# Patient Record
Sex: Female | Born: 1970 | Hispanic: Yes | Marital: Married | Smoking: Never smoker
Health system: Southern US, Community
[De-identification: ages and names within clinical notes are randomized; demographics above are authoritative.]

## PROBLEM LIST (undated history)

## (undated) DIAGNOSIS — G709 Myoneural disorder, unspecified: Secondary | ICD-10-CM

## (undated) HISTORY — DX: Myoneural disorder, unspecified: G70.9

---

## 2003-10-19 ENCOUNTER — Emergency Department (HOSPITAL_COMMUNITY): Admission: EM | Admit: 2003-10-19 | Discharge: 2003-10-19 | Payer: Self-pay | Admitting: *Deleted

## 2016-01-07 DIAGNOSIS — Z0000 Encounter for general adult medical examination without abnormal findings: Secondary | ICD-10-CM | POA: Diagnosis not present

## 2016-01-07 DIAGNOSIS — Z131 Encounter for screening for diabetes mellitus: Secondary | ICD-10-CM | POA: Diagnosis not present

## 2016-01-07 DIAGNOSIS — D649 Anemia, unspecified: Secondary | ICD-10-CM | POA: Diagnosis not present

## 2016-01-07 DIAGNOSIS — Z130 Encounter for screening for diseases of the blood and blood-forming organs and certain disorders involving the immune mechanism: Secondary | ICD-10-CM | POA: Diagnosis not present

## 2016-01-14 DIAGNOSIS — R4189 Other symptoms and signs involving cognitive functions and awareness: Secondary | ICD-10-CM | POA: Diagnosis not present

## 2016-01-28 DIAGNOSIS — R4189 Other symptoms and signs involving cognitive functions and awareness: Secondary | ICD-10-CM | POA: Diagnosis not present

## 2016-01-28 DIAGNOSIS — Z1329 Encounter for screening for other suspected endocrine disorder: Secondary | ICD-10-CM | POA: Diagnosis not present

## 2016-02-13 DIAGNOSIS — Z79899 Other long term (current) drug therapy: Secondary | ICD-10-CM | POA: Diagnosis not present

## 2016-02-13 DIAGNOSIS — F0390 Unspecified dementia without behavioral disturbance: Secondary | ICD-10-CM | POA: Diagnosis not present

## 2016-02-13 DIAGNOSIS — R197 Diarrhea, unspecified: Secondary | ICD-10-CM | POA: Diagnosis not present

## 2016-02-13 DIAGNOSIS — R262 Difficulty in walking, not elsewhere classified: Secondary | ICD-10-CM | POA: Diagnosis not present

## 2016-02-13 DIAGNOSIS — N390 Urinary tract infection, site not specified: Secondary | ICD-10-CM | POA: Diagnosis not present

## 2016-02-13 DIAGNOSIS — R112 Nausea with vomiting, unspecified: Secondary | ICD-10-CM | POA: Diagnosis not present

## 2016-02-13 DIAGNOSIS — R531 Weakness: Secondary | ICD-10-CM | POA: Diagnosis not present

## 2016-03-18 DIAGNOSIS — R413 Other amnesia: Secondary | ICD-10-CM | POA: Diagnosis not present

## 2016-03-18 DIAGNOSIS — Z789 Other specified health status: Secondary | ICD-10-CM | POA: Diagnosis not present

## 2016-03-18 DIAGNOSIS — Z719 Counseling, unspecified: Secondary | ICD-10-CM | POA: Diagnosis not present

## 2016-03-20 DIAGNOSIS — R413 Other amnesia: Secondary | ICD-10-CM | POA: Diagnosis not present

## 2016-04-09 DIAGNOSIS — R413 Other amnesia: Secondary | ICD-10-CM | POA: Diagnosis not present

## 2016-04-19 DIAGNOSIS — R262 Difficulty in walking, not elsewhere classified: Secondary | ICD-10-CM | POA: Diagnosis not present

## 2016-04-19 DIAGNOSIS — R413 Other amnesia: Secondary | ICD-10-CM | POA: Diagnosis not present

## 2016-04-19 DIAGNOSIS — M50222 Other cervical disc displacement at C5-C6 level: Secondary | ICD-10-CM | POA: Diagnosis not present

## 2016-04-19 DIAGNOSIS — R443 Hallucinations, unspecified: Secondary | ICD-10-CM | POA: Diagnosis not present

## 2016-04-28 DIAGNOSIS — R413 Other amnesia: Secondary | ICD-10-CM | POA: Diagnosis not present

## 2016-04-28 DIAGNOSIS — Z719 Counseling, unspecified: Secondary | ICD-10-CM | POA: Diagnosis not present

## 2016-04-28 DIAGNOSIS — Z789 Other specified health status: Secondary | ICD-10-CM | POA: Diagnosis not present

## 2016-04-29 DIAGNOSIS — Z993 Dependence on wheelchair: Secondary | ICD-10-CM | POA: Diagnosis not present

## 2016-04-29 DIAGNOSIS — G3289 Other specified degenerative disorders of nervous system in diseases classified elsewhere: Secondary | ICD-10-CM | POA: Diagnosis not present

## 2016-04-29 DIAGNOSIS — R32 Unspecified urinary incontinence: Secondary | ICD-10-CM | POA: Diagnosis not present

## 2016-04-29 DIAGNOSIS — E039 Hypothyroidism, unspecified: Secondary | ICD-10-CM | POA: Diagnosis not present

## 2016-04-29 DIAGNOSIS — R413 Other amnesia: Secondary | ICD-10-CM | POA: Diagnosis not present

## 2016-04-29 DIAGNOSIS — M6281 Muscle weakness (generalized): Secondary | ICD-10-CM | POA: Diagnosis not present

## 2016-05-03 DIAGNOSIS — Z993 Dependence on wheelchair: Secondary | ICD-10-CM | POA: Diagnosis not present

## 2016-05-03 DIAGNOSIS — R32 Unspecified urinary incontinence: Secondary | ICD-10-CM | POA: Diagnosis not present

## 2016-05-03 DIAGNOSIS — E039 Hypothyroidism, unspecified: Secondary | ICD-10-CM | POA: Diagnosis not present

## 2016-05-03 DIAGNOSIS — M6281 Muscle weakness (generalized): Secondary | ICD-10-CM | POA: Diagnosis not present

## 2016-05-03 DIAGNOSIS — G3289 Other specified degenerative disorders of nervous system in diseases classified elsewhere: Secondary | ICD-10-CM | POA: Diagnosis not present

## 2016-05-03 DIAGNOSIS — R413 Other amnesia: Secondary | ICD-10-CM | POA: Diagnosis not present

## 2016-05-05 DIAGNOSIS — R413 Other amnesia: Secondary | ICD-10-CM | POA: Diagnosis not present

## 2016-05-05 DIAGNOSIS — G3289 Other specified degenerative disorders of nervous system in diseases classified elsewhere: Secondary | ICD-10-CM | POA: Diagnosis not present

## 2016-05-05 DIAGNOSIS — R32 Unspecified urinary incontinence: Secondary | ICD-10-CM | POA: Diagnosis not present

## 2016-05-05 DIAGNOSIS — Z993 Dependence on wheelchair: Secondary | ICD-10-CM | POA: Diagnosis not present

## 2016-05-05 DIAGNOSIS — E039 Hypothyroidism, unspecified: Secondary | ICD-10-CM | POA: Diagnosis not present

## 2016-05-05 DIAGNOSIS — M6281 Muscle weakness (generalized): Secondary | ICD-10-CM | POA: Diagnosis not present

## 2016-05-07 DIAGNOSIS — Z993 Dependence on wheelchair: Secondary | ICD-10-CM | POA: Diagnosis not present

## 2016-05-07 DIAGNOSIS — R32 Unspecified urinary incontinence: Secondary | ICD-10-CM | POA: Diagnosis not present

## 2016-05-07 DIAGNOSIS — R413 Other amnesia: Secondary | ICD-10-CM | POA: Diagnosis not present

## 2016-05-07 DIAGNOSIS — G3289 Other specified degenerative disorders of nervous system in diseases classified elsewhere: Secondary | ICD-10-CM | POA: Diagnosis not present

## 2016-05-07 DIAGNOSIS — M6281 Muscle weakness (generalized): Secondary | ICD-10-CM | POA: Diagnosis not present

## 2016-05-07 DIAGNOSIS — E039 Hypothyroidism, unspecified: Secondary | ICD-10-CM | POA: Diagnosis not present

## 2016-05-10 DIAGNOSIS — R32 Unspecified urinary incontinence: Secondary | ICD-10-CM | POA: Diagnosis not present

## 2016-05-10 DIAGNOSIS — G3289 Other specified degenerative disorders of nervous system in diseases classified elsewhere: Secondary | ICD-10-CM | POA: Diagnosis not present

## 2016-05-10 DIAGNOSIS — E039 Hypothyroidism, unspecified: Secondary | ICD-10-CM | POA: Diagnosis not present

## 2016-05-10 DIAGNOSIS — Z993 Dependence on wheelchair: Secondary | ICD-10-CM | POA: Diagnosis not present

## 2016-05-10 DIAGNOSIS — R413 Other amnesia: Secondary | ICD-10-CM | POA: Diagnosis not present

## 2016-05-10 DIAGNOSIS — M6281 Muscle weakness (generalized): Secondary | ICD-10-CM | POA: Diagnosis not present

## 2016-05-12 DIAGNOSIS — G3289 Other specified degenerative disorders of nervous system in diseases classified elsewhere: Secondary | ICD-10-CM | POA: Diagnosis not present

## 2016-05-12 DIAGNOSIS — R413 Other amnesia: Secondary | ICD-10-CM | POA: Diagnosis not present

## 2016-05-12 DIAGNOSIS — R32 Unspecified urinary incontinence: Secondary | ICD-10-CM | POA: Diagnosis not present

## 2016-05-12 DIAGNOSIS — M6281 Muscle weakness (generalized): Secondary | ICD-10-CM | POA: Diagnosis not present

## 2016-05-12 DIAGNOSIS — E039 Hypothyroidism, unspecified: Secondary | ICD-10-CM | POA: Diagnosis not present

## 2016-05-12 DIAGNOSIS — Z993 Dependence on wheelchair: Secondary | ICD-10-CM | POA: Diagnosis not present

## 2016-05-14 DIAGNOSIS — R32 Unspecified urinary incontinence: Secondary | ICD-10-CM | POA: Diagnosis not present

## 2016-05-14 DIAGNOSIS — G3289 Other specified degenerative disorders of nervous system in diseases classified elsewhere: Secondary | ICD-10-CM | POA: Diagnosis not present

## 2016-05-14 DIAGNOSIS — Z993 Dependence on wheelchair: Secondary | ICD-10-CM | POA: Diagnosis not present

## 2016-05-14 DIAGNOSIS — M6281 Muscle weakness (generalized): Secondary | ICD-10-CM | POA: Diagnosis not present

## 2016-05-14 DIAGNOSIS — E039 Hypothyroidism, unspecified: Secondary | ICD-10-CM | POA: Diagnosis not present

## 2016-05-14 DIAGNOSIS — R413 Other amnesia: Secondary | ICD-10-CM | POA: Diagnosis not present

## 2016-05-15 ENCOUNTER — Ambulatory Visit (INDEPENDENT_AMBULATORY_CARE_PROVIDER_SITE_OTHER): Payer: Medicare Other

## 2016-05-15 ENCOUNTER — Ambulatory Visit (INDEPENDENT_AMBULATORY_CARE_PROVIDER_SITE_OTHER): Payer: Medicare Other | Admitting: Family Medicine

## 2016-05-15 VITALS — BP 126/76 | HR 99 | Temp 98.0°F | Resp 17

## 2016-05-15 DIAGNOSIS — R600 Localized edema: Secondary | ICD-10-CM

## 2016-05-15 DIAGNOSIS — M25512 Pain in left shoulder: Secondary | ICD-10-CM

## 2016-05-15 DIAGNOSIS — G8929 Other chronic pain: Secondary | ICD-10-CM | POA: Diagnosis not present

## 2016-05-15 MED ORDER — FUROSEMIDE 20 MG PO TABS: 20.0000 mg | ORAL_TABLET | Freq: Every day | ORAL | 0 refills | Status: DC

## 2016-05-15 MED ORDER — DICLOFENAC SODIUM 3 % EX CREA: 1.0000 "application " | TOPICAL_CREAM | Freq: Three times a day (TID) | CUTANEOUS | 0 refills | Status: AC

## 2016-05-15 NOTE — Patient Instructions (Addendum)
Try a medium grade of compression socks, around 15 mmHg, size small to medium. You can find an excellent repair of these at [employer], [employer] sporting goods, [employer], or [employer]. I have tried many different type on [place] with WONDERFUL success such as [employer] and [employer].  Try to have her drink more water. Make sure she continues to get plenty of protein in the diet. Try to elevate her legs whenever possible. When she is sitting during the day if she could put them on a cushion or autoimmune to elevate that would be great. We may need to try to get her a wheelchair that has the ability to elevate feet. At night putting a small wedge pillow or even some towels underneath her feet can help substantially.  If none of this works it may be worth trying one week off of the Seroquel to see if there are indeed any changes in sleep or mood and the swelling. If it is not helping sleep or mood and contributing to the swelling then perhaps it is not the medication for her.  Start the Lasix once a day in the morning. This will help remove any extra fluid from the body. It can lower her potassium so make sure Melissa Stewart continues to drink a lot of water and gets 1 or 2 extra sources of potassium in her diet that day. See list attached  IF you received an x-ray today, you will receive an invoice from [facility]. Please contact [facility] at [phone] with questions or concerns regarding your invoice.   IF you received labwork today, you will receive an invoice from [facility]. Please contact [facility] at [phone] with questions or concerns regarding your invoice.   Our billing staff will not be able to assist you with questions regarding bills from these companies.  You will be contacted with the lab results as soon as they are available. The fastest way to get your results is to activate your My Chart account. Instructions are located on the last page of this paperwork. If you  have not heard from [place] regarding the results in 2 weeks, please contact this office.     Peripheral Edema Peripheral edema is swelling that is caused by a buildup of fluid. Peripheral edema most often affects the lower legs, ankles, and feet. It can also develop in the arms, hands, and face. The area of the body that has peripheral edema will look swollen. It may also feel heavy or warm. Your clothes may start to feel tight. Pressing on the area may make a temporary dent in your skin. You may not be able to move your arm or leg as much as usual. There are many causes of peripheral edema. It can be a complication of other diseases, such as congestive heart failure, kidney disease, or a problem with your blood circulation. It also can be a side effect of certain medicines. It often happens to women during pregnancy. Sometimes, the cause is not known. Treating the underlying condition is often the only treatment for peripheral edema. Follow these instructions at home: Pay attention to any changes in your symptoms. Take these actions to help with your discomfort:  Raise (elevate) your legs while you are sitting or lying down.  Move around often to prevent stiffness and to lessen swelling. Do not sit or stand for long periods of time.  Wear support stockings as told by your health care provider.  Follow instructions from your health care provider about limiting  salt (sodium) in your diet. Sometimes eating less salt can reduce swelling.  Take over-the-counter and prescription medicines only as told by your health care provider. Your health care provider may prescribe medicine to help your body get rid of excess water (diuretic).  Keep all follow-up visits as told by your health care provider. This is important. Contact a health care provider if:  You have a fever.  Your edema starts suddenly or is getting worse, especially if you are pregnant or have a medical condition.  You have swelling in  only one leg.  You have increased swelling and pain in your legs. Get help right away if:  You develop shortness of breath, especially when you are lying down.  You have pain in your chest or abdomen.  You feel weak.  You faint. This information is not intended to replace advice given to you by your health care provider. Make sure you discuss any questions you have with your health care provider. Document Released: 04/29/2004 Document Revised: 08/25/2015 Document Reviewed: 10/02/2014 Elsevier Interactive Patient Education  2017 Elsevier Inc.  Potassium Content of Foods Potassium is a mineral found in many foods and drinks. It helps keep fluids and minerals balanced in your body and affects how steadily your heart beats. Potassium also helps control your blood pressure and keep your muscles and nervous system healthy. Certain health conditions and medicines may change the balance of potassium in your body. When this happens, you can help balance your level of potassium through the foods that you do or do not eat. Your health care provider or dietitian may recommend an amount of potassium that you should have each day. The following lists of foods provide the amount of potassium (in parentheses) per serving in each item. High in potassium The following foods and beverages have 200 mg or more of potassium per serving:  Apricots, 2 raw or 5 dry (200 mg).  Artichoke, 1 medium (345 mg).  Avocado, raw,  each (245 mg).  Banana, 1 medium (425 mg).  Beans, lima, or baked beans, canned,  cup (280 mg).  Beans, white, canned,  cup (595 mg).  Beef roast, 3 oz (320 mg).  Beef, ground, 3 oz (270 mg).  Beets, raw or cooked,  cup (260 mg).  Bran muffin, 2 oz (300 mg).  Broccoli,  cup (230 mg).  Brussels sprouts,  cup (250 mg).  Cantaloupe,  cup (215 mg).  Cereal, 100% bran,  cup (200-400 mg).  Cheeseburger, single, fast food, 1 each (225-400 mg).  Chicken, 3 oz (220  mg).  Clams, canned, 3 oz (535 mg).  Crab, 3 oz (225 mg).  Dates, 5 each (270 mg).  Dried beans and peas,  cup (300-475 mg).  Figs, dried, 2 each (260 mg).  Fish: halibut, tuna, cod, snapper, 3 oz (480 mg).  Fish: salmon, haddock, swordfish, perch, 3 oz (300 mg).  Fish, tuna, canned 3 oz (200 mg).  [place] fries, fast food, 3 oz (470 mg).  Granola with fruit and nuts,  cup (200 mg).  Grapefruit juice,  cup (200 mg).  Greens, beet,  cup (655 mg).  Honeydew melon,  cup (200 mg).  Kale, raw, 1 cup (300 mg).  Kiwi, 1 medium (240 mg).  Kohlrabi, rutabaga, parsnips,  cup (280 mg).  Lentils,  cup (365 mg).  Mango, 1 each (325 mg).  Milk, chocolate, 1 cup (420 mg).  Milk: nonfat, low-fat, whole, buttermilk, 1 cup (350-380 mg).  Molasses, 1 Tbsp (295 mg).  Mushrooms,  cup (280) mg.  Nectarine, 1 each (275 mg).  Nuts: almonds, peanuts, hazelnuts, [place], cashew, mixed, 1 oz (200 mg).  Nuts, pistachios, 1 oz (295 mg).  Orange, 1 each (240 mg).  Orange juice,  cup (235 mg).  Papaya, medium,  fruit (390 mg).  Peanut butter, chunky, 2 Tbsp (240 mg).  Peanut butter, smooth, 2 Tbsp (210 mg).  Pear, 1 medium (200 mg).  Pomegranate, 1 whole (400 mg).  Pomegranate juice,  cup (215 mg).  Pork, 3 oz (350 mg).  Potato chips, salted, 1 oz (465 mg).  Potato, baked with skin, 1 medium (925 mg).  Potatoes, boiled,  cup (255 mg).  Potatoes, mashed,  cup (330 mg).  Prune juice,  cup (370 mg).  Prunes, 5 each (305 mg).  Pudding, chocolate,  cup (230 mg).  Pumpkin, canned,  cup (250 mg).  Raisins, seedless,  cup (270 mg).  Seeds, sunflower or pumpkin, 1 oz (240 mg).  Soy milk, 1 cup (300 mg).  Spinach,  cup (420 mg).  Spinach, canned,  cup (370 mg).  Sweet potato, baked with skin, 1 medium (450 mg).  Swiss chard,  cup (480 mg).  Tomato or vegetable juice,  cup (275 mg).  Tomato sauce or puree,  cup (400-550 mg).  Tomato,  raw, 1 medium (290 mg).  Tomatoes, canned,  cup (200-300 mg).  [place], 3 oz (250 mg).  Wheat germ, 1 oz (250 mg).  Winter squash,  cup (250 mg).  Yogurt, plain or fruited, 6 oz (260-435 mg).  Zucchini,  cup (220 mg). Moderate in potassium The following foods and beverages have 50-200 mg of potassium per serving:  Apple, 1 each (150 mg).  Apple juice,  cup (150 mg).  Applesauce,  cup (90 mg).  Apricot nectar,  cup (140 mg).  Asparagus, small spears,  cup or 6 spears (155 mg).  Bagel, cinnamon raisin, 1 each (130 mg).  Bagel, egg or plain, 4 in., 1 each (70 mg).  Beans, green,  cup (90 mg).  Beans, yellow,  cup (190 mg).  Beer, regular, 12 oz (100 mg).  Beets, canned,  cup (125 mg).  Blackberries,  cup (115 mg).  Blueberries,  cup (60 mg).  Bread, whole wheat, 1 slice (70 mg).  Broccoli, raw,  cup (145 mg).  Cabbage,  cup (150 mg).  Carrots, cooked or raw,  cup (180 mg).  Cauliflower, raw,  cup (150 mg).  Celery, raw,  cup (155 mg).  Cereal, bran flakes, cup (120-150 mg).  Cheese, cottage,  cup (110 mg).  Cherries, 10 each (150 mg).  Chocolate, 1 oz bar (165 mg).  Coffee, brewed 6 oz (90 mg).  Corn,  cup or 1 ear (195 mg).  Cucumbers,  cup (80 mg).  Egg, large, 1 each (60 mg).  Eggplant,  cup (60 mg).  Endive, raw, cup (80 mg).  English muffin, 1 each (65 mg).  Fish, orange roughy, 3 oz (150 mg).  Frankfurter, beef or pork, 1 each (75 mg).  Fruit cocktail,  cup (115 mg).  Grape juice,  cup (170 mg).  Grapefruit,  fruit (175 mg).  Grapes,  cup (155 mg).  Greens: kale, turnip, collard,  cup (110-150 mg).  Ice cream or frozen yogurt, chocolate,  cup (175 mg).  Ice cream or frozen yogurt, vanilla,  cup (120-150 mg).  Lemons, limes, 1 each (80 mg).  Lettuce, all types, 1 cup (100 mg).  Mixed vegetables,  cup (150 mg).  Mushrooms, raw,  cup (110 mg).  Nuts: walnuts, pecans, or macadamia, 1  oz (125 mg).  Oatmeal,  cup (80 mg).  Okra,  cup (110 mg).  Onions, raw,  cup (120 mg).  Peach, 1 each (185 mg).  Peaches, canned,  cup (120 mg).  Pears, canned,  cup (120 mg).  Peas, green, frozen,  cup (90 mg).  Peppers, green,  cup (130 mg).  Peppers, red,  cup (160 mg).  Pineapple juice,  cup (165 mg).  Pineapple, fresh or canned,  cup (100 mg).  Plums, 1 each (105 mg).  Pudding, vanilla,  cup (150 mg).  Raspberries,  cup (90 mg).  Rhubarb,  cup (115 mg).  Rice, wild,  cup (80 mg).  Shrimp, 3 oz (155 mg).  Spinach, raw, 1 cup (170 mg).  Strawberries,  cup (125 mg).  Summer squash  cup (175-200 mg).  Swiss chard, raw, 1 cup (135 mg).  Tangerines, 1 each (140 mg).  Tea, brewed, 6 oz (65 mg).  Turnips,  cup (140 mg).  Watermelon,  cup (85 mg).  Wine, red, table, 5 oz (180 mg).  Wine, white, table, 5 oz (100 mg). Low in potassium The following foods and beverages have less than 50 mg of potassium per serving.  Bread, white, 1 slice (30 mg).  Carbonated beverages, 12 oz (less than 5 mg).  Cheese, 1 oz (20-30 mg).  Cranberries,  cup (45 mg).  Cranberry juice cocktail,  cup (20 mg).  Fats and oils, 1 Tbsp (less than 5 mg).  Hummus, 1 Tbsp (32 mg).  Nectar: papaya, mango, or pear,  cup (35 mg).  Rice, white or brown,  cup (50 mg).  Spaghetti or macaroni,  cup cooked (30 mg).  Tortilla, flour or corn, 1 each (50 mg).  Waffle, 4 in., 1 each (50 mg).  Water chestnuts,  cup (40 mg). This information is not intended to replace advice given to you by your health care provider. Make sure you discuss any questions you have with your health care provider. Document Released: 11/03/2004 Document Revised: 08/28/2015 Document Reviewed: 02/16/2013 Elsevier Interactive Patient Education  2017 [employer].

## 2016-05-15 NOTE — Progress Notes (Signed)
Subjective:  By signing my name below, I, Essence Howell, attest that this documentation has been prepared under the direction and in the presence of Delman Cheadle, MD Electronically Signed: Ladene Artist, ED Scribe 05/15/2016 at 11:35 AM.   Patient ID: Melissa Stewart, female    DOB: Aug 09, 1970, 46 y.o.   MRN: [id number]  Chief Complaint  Patient presents with  . Leg Pain    swelling in legs    HPI  Melissa Stewart is a 46 y.o. female who presents to Primary Care at [facility] complaining of gradual onset of swelling to the legs, knees, feet and left shoulder first noticed last week. Pt recently started physical therapy around January 26 for improved ambulation for a neuromuscular disorder which is followed by Dr. Elisabeth Cara with [facility]. Pt last had blood work obtained in January which was normal. She spends most of her time in a chair but has been trying to use a walker. She also started both Seroquel and Aricept around January 25 as well. However, pt's sister states that Seroquel has not provided any relief of chronic sleep difficulty. No OTC medications tried PTC for relief of gradually worsening left shoulder pain. Pt is right hand dominant. She denies chest pain, chest tightness, sob, changes in sleep, loss of appetite, changes in urine or bowels. Her  sister recently became her legal guardian in November 2017 and states that pt has a h/o leg swelling that was more severe prior to when she obtained guardianship and pt beginning Seroquel. She attributes this to pt sitting in a chair for 8 hours/day. No h/o heart complications.   Past Medical History:  Diagnosis Date  . Neuromuscular disorder ([place])    No current outpatient prescriptions on file prior to visit.   No current facility-administered medications on file prior to visit.    Not on File  Review of Systems  Constitutional: Negative for appetite change.  Respiratory: Negative for chest tightness and shortness of breath.     Cardiovascular: Positive for leg swelling. Negative for chest pain.  Gastrointestinal: Negative for constipation and diarrhea.  Genitourinary: Negative for difficulty urinating and hematuria.  Psychiatric/Behavioral: Positive for sleep disturbance (chronic; unchanged).      Objective:   Physical Exam  Constitutional: She is oriented to person, place, and time. She appears well-developed and well-nourished. No distress.  HENT:  Head: Normocephalic and atraumatic.  Eyes: Conjunctivae and EOM are normal.  Neck: Neck supple. No tracheal deviation present.  Cardiovascular: Normal rate, regular rhythm, S1 normal, S2 normal and normal heart sounds.   Pulmonary/Chest: Effort normal and breath sounds normal. No respiratory distress.  Good air movement. Lungs are clear to auscultation.  Musculoskeletal: Normal range of motion.  1+ pitting edema to knees. Large palpable trapezius muscle spasms at the top of the thoracic approximately the size of a palm. No erythema or warmth. No point bony tenderness over the shoulders. Appears symmetrical. No pitting edema along sacrum. R shoulder abduction to 75 degrees, L shoulder abduction to 80 degrees. Unable to raise or lower shoulders.   Neurological: She is alert and oriented to person, place, and time.  Skin: Skin is warm and dry.  Psychiatric: She has a normal mood and affect. Her behavior is normal.  Nursing note and vitals reviewed.  BP 126/76 (BP Location: Left Arm, Patient Position: Sitting, Cuff Size: Large)   Pulse 99   Temp 98 F (36.7 C) (Oral)   Resp 17   SpO2 94%  Dg Shoulder Left  Result Date: 05/15/2016 CLINICAL DATA:  Lt shoulder pain no known injury Sister felt knot posterior shoulder NCP shielded EXAM: LEFT SHOULDER - 2+ VIEW COMPARISON:  None. FINDINGS: Glenohumeral joint is intact. No evidence of scapular fracture or humeral fracture. The acromioclavicular joint is intact. IMPRESSION: 1. No fracture or dislocation. 2. 3. No  arthropathy identified. Electronically Signed   By: Suzy Bouchard M.D.   On: 05/15/2016 12:42   Assessment & Plan:   1. Chronic left shoulder pain   2. Pedal edema    Ok to make appt at 104 to establish care if her family would like. She has some congenital cognitive and neuromuscular disorder.  Her sisters just recently obtained care of her so much is unknown.  Needs to elevate legs, try compression socks.  Orders Placed This Encounter  Procedures  . DG Shoulder Left    Standing Status:   Future    Number of Occurrences:   1    Standing Expiration Date:   05/15/2017    Order Specific Question:   Reason for Exam (SYMPTOM  OR DIAGNOSIS REQUIRED)    Answer:   PAIN AND SWELLING    Order Specific Question:   Is the patient pregnant?    Answer:   No    Order Specific Question:   Preferred imaging location?    Answer:   External  . Comprehensive metabolic panel  . CBC with Differential/Platelet  . CK    Meds ordered this encounter  Medications  . donepezil (ARICEPT) 10 MG tablet    Sig: Take 10 mg by mouth at bedtime.  Marland Kitchen QUEtiapine (SEROQUEL) 100 MG tablet    Sig: Take 100 mg by mouth at bedtime.  . furosemide (LASIX) 20 MG tablet    Sig: Take 1 tablet (20 mg total) by mouth daily.    Dispense:  30 tablet    Refill:  0  . Diclofenac Sodium 3 % CREA    Sig: Apply 1 application topically 3 (three) times daily.    Dispense:  2500 g    Refill:  0    I personally performed the services described in this documentation, which was scribed in my presence. The recorded information has been reviewed and considered, and addended by me as needed.   Delman Cheadle, M.D.  Primary Care at [facility] [address] [place], [place] [address] [phone] phone [phone] fax  05/30/16 12:57 AM

## 2016-05-17 DIAGNOSIS — E039 Hypothyroidism, unspecified: Secondary | ICD-10-CM | POA: Diagnosis not present

## 2016-05-17 DIAGNOSIS — R413 Other amnesia: Secondary | ICD-10-CM | POA: Diagnosis not present

## 2016-05-17 DIAGNOSIS — G3289 Other specified degenerative disorders of nervous system in diseases classified elsewhere: Secondary | ICD-10-CM | POA: Diagnosis not present

## 2016-05-17 DIAGNOSIS — R32 Unspecified urinary incontinence: Secondary | ICD-10-CM | POA: Diagnosis not present

## 2016-05-17 DIAGNOSIS — M6281 Muscle weakness (generalized): Secondary | ICD-10-CM | POA: Diagnosis not present

## 2016-05-17 DIAGNOSIS — Z993 Dependence on wheelchair: Secondary | ICD-10-CM | POA: Diagnosis not present

## 2016-05-17 LAB — COMPREHENSIVE METABOLIC PANEL
ALK PHOS: 114 IU/L (ref 39–117)
ALT: 44 IU/L — ABNORMAL HIGH (ref 0–32)
AST: 31 IU/L (ref 0–40)
Albumin/Globulin Ratio: 1.3 (ref 1.2–2.2)
Albumin: 4.1 g/dL (ref 3.5–5.5)
BUN / CREAT RATIO: 14 (ref 9–23)
BUN: 12 mg/dL (ref 6–24)
CHLORIDE: 102 mmol/L (ref 96–106)
CO2: 24 mmol/L (ref 18–29)
CREATININE: 0.86 mg/dL (ref 0.57–1.00)
Calcium: 9.2 mg/dL (ref 8.7–10.2)
GFR calc Af Amer: 94 mL/min/{1.73_m2} (ref >59)
GFR calc non Af Amer: 82 mL/min/{1.73_m2} (ref >59)
GLOBULIN, TOTAL: 3.1 g/dL (ref 1.5–4.5)
GLUCOSE: 86 mg/dL (ref 65–99)
Potassium: 3.7 mmol/L (ref 3.5–5.2)
SODIUM: 143 mmol/L (ref 134–144)
Total Protein: 7.2 g/dL (ref 6.0–8.5)

## 2016-05-17 LAB — CBC WITH DIFFERENTIAL/PLATELET

## 2016-05-17 LAB — CK: Total CK: 65 U/L (ref 24–173)

## 2016-05-19 DIAGNOSIS — M6281 Muscle weakness (generalized): Secondary | ICD-10-CM | POA: Diagnosis not present

## 2016-05-19 DIAGNOSIS — G3289 Other specified degenerative disorders of nervous system in diseases classified elsewhere: Secondary | ICD-10-CM | POA: Diagnosis not present

## 2016-05-19 DIAGNOSIS — E039 Hypothyroidism, unspecified: Secondary | ICD-10-CM | POA: Diagnosis not present

## 2016-05-19 DIAGNOSIS — R32 Unspecified urinary incontinence: Secondary | ICD-10-CM | POA: Diagnosis not present

## 2016-05-19 DIAGNOSIS — R413 Other amnesia: Secondary | ICD-10-CM | POA: Diagnosis not present

## 2016-05-19 DIAGNOSIS — Z993 Dependence on wheelchair: Secondary | ICD-10-CM | POA: Diagnosis not present

## 2016-05-24 DIAGNOSIS — G3289 Other specified degenerative disorders of nervous system in diseases classified elsewhere: Secondary | ICD-10-CM | POA: Diagnosis not present

## 2016-05-24 DIAGNOSIS — M6281 Muscle weakness (generalized): Secondary | ICD-10-CM | POA: Diagnosis not present

## 2016-05-24 DIAGNOSIS — R32 Unspecified urinary incontinence: Secondary | ICD-10-CM | POA: Diagnosis not present

## 2016-05-24 DIAGNOSIS — Z993 Dependence on wheelchair: Secondary | ICD-10-CM | POA: Diagnosis not present

## 2016-05-24 DIAGNOSIS — E039 Hypothyroidism, unspecified: Secondary | ICD-10-CM | POA: Diagnosis not present

## 2016-05-24 DIAGNOSIS — R413 Other amnesia: Secondary | ICD-10-CM | POA: Diagnosis not present

## 2016-05-26 DIAGNOSIS — M6281 Muscle weakness (generalized): Secondary | ICD-10-CM | POA: Diagnosis not present

## 2016-05-26 DIAGNOSIS — R32 Unspecified urinary incontinence: Secondary | ICD-10-CM | POA: Diagnosis not present

## 2016-05-26 DIAGNOSIS — E039 Hypothyroidism, unspecified: Secondary | ICD-10-CM | POA: Diagnosis not present

## 2016-05-26 DIAGNOSIS — Z993 Dependence on wheelchair: Secondary | ICD-10-CM | POA: Diagnosis not present

## 2016-05-26 DIAGNOSIS — R413 Other amnesia: Secondary | ICD-10-CM | POA: Diagnosis not present

## 2016-05-26 DIAGNOSIS — G3289 Other specified degenerative disorders of nervous system in diseases classified elsewhere: Secondary | ICD-10-CM | POA: Diagnosis not present

## 2016-06-19 ENCOUNTER — Ambulatory Visit (INDEPENDENT_AMBULATORY_CARE_PROVIDER_SITE_OTHER): Payer: Medicare Other

## 2016-06-19 ENCOUNTER — Ambulatory Visit (INDEPENDENT_AMBULATORY_CARE_PROVIDER_SITE_OTHER): Payer: Medicare Other | Admitting: Family Medicine

## 2016-06-19 VITALS — BP 123/85 | HR 83 | Temp 98.5°F | Resp 18

## 2016-06-19 DIAGNOSIS — R32 Unspecified urinary incontinence: Secondary | ICD-10-CM

## 2016-06-19 DIAGNOSIS — R609 Edema, unspecified: Secondary | ICD-10-CM | POA: Diagnosis not present

## 2016-06-19 DIAGNOSIS — R1084 Generalized abdominal pain: Secondary | ICD-10-CM | POA: Diagnosis not present

## 2016-06-19 DIAGNOSIS — R740 Nonspecific elevation of levels of transaminase and lactic acid dehydrogenase [LDH]: Secondary | ICD-10-CM

## 2016-06-19 DIAGNOSIS — M791 Myalgia, unspecified site: Secondary | ICD-10-CM

## 2016-06-19 DIAGNOSIS — E6609 Other obesity due to excess calories: Secondary | ICD-10-CM

## 2016-06-19 DIAGNOSIS — R4189 Other symptoms and signs involving cognitive functions and awareness: Secondary | ICD-10-CM

## 2016-06-19 DIAGNOSIS — G709 Myoneural disorder, unspecified: Secondary | ICD-10-CM | POA: Insufficient documentation

## 2016-06-19 DIAGNOSIS — F819 Developmental disorder of scholastic skills, unspecified: Secondary | ICD-10-CM | POA: Diagnosis not present

## 2016-06-19 DIAGNOSIS — R7401 Elevation of levels of liver transaminase levels: Secondary | ICD-10-CM

## 2016-06-19 DIAGNOSIS — R159 Full incontinence of feces: Secondary | ICD-10-CM

## 2016-06-19 DIAGNOSIS — K5900 Constipation, unspecified: Secondary | ICD-10-CM | POA: Diagnosis not present

## 2016-06-19 MED ORDER — QUETIAPINE FUMARATE 200 MG PO TABS: 200.0000 mg | ORAL_TABLET | Freq: Every day | ORAL | 2 refills | Status: DC

## 2016-06-19 MED ORDER — FUROSEMIDE 40 MG PO TABS: 40.0000 mg | ORAL_TABLET | Freq: Every day | ORAL | 2 refills | Status: DC

## 2016-06-19 MED ORDER — POLYETHYLENE GLYCOL 3350 17 GM/SCOOP PO POWD: 17.0000 g | Freq: Every day | ORAL | 1 refills | Status: DC

## 2016-06-19 MED ORDER — POTASSIUM CHLORIDE 20 MEQ PO PACK: 20.0000 meq | PACK | Freq: Every day | ORAL | 2 refills | Status: DC

## 2016-06-19 NOTE — Progress Notes (Signed)
Subjective:  By signing my name below, I, Essence Howell, attest that this documentation has been prepared under the direction and in the presence of Norberto SorensonEva Castor Gittleman, MD Electronically Signed: Charline BillsEssence Howell, ED Scribe 06/19/2016 at 2:36 PM.   Patient ID: Melissa Stewart, female    DOB: 07/19/1970, 46 y.o.   MRN: [id number]  Chief Complaint  Patient presents with  . Edema    Feet, unsure of time frame   HPI Melissa Bilbina Yeske is a 46 y.o. female who presents to Primary Care at [facility]. Seen a little over 1 month prior. 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 She was being cared for by relative with very poor health, not well. So recently her sister  Became her legal guardian, seen last month concerned about worsening leg swelling. She had started on Seroquel but spends most of the day sitting in a chair. So recommended leg elevation, compression stockings, change position frequently if possible. CK and CMP were normal. Pt started on Lasix.   Pt ran out of Lasix over 1 week ago, however, her family report that they have not noticed a significant difference in fluid retention since pt started Lasix. They report abdominal distention, leg swelling, "loose skin" and generalized "puffiness". They further report that pt complains of feeling uncomfortable and feeling sore often. They are unsure if pt is experiencing chest or abdominal pain since she is unable to express it. They have not noticed loss of appetite or constipation. Pt wears Depend. She is currently consuming ~3-4 bottles of water daily and lemonade. Pt's diet varies. Her breakfast this morning included sausage, mashed potato, scrambled eggs and tortilla. Family denies adding salt to her food. Family also states that the pt alternates from her bed, chair and sofa throughout the day without changes in leg swelling.   Sleep Disturbances  Pt's report that she has been having difficulty sleeping  over the past few days. Stating that pt has been "babbling nonsense" in her sleep, experiences rare apnea and they have noticed that she wakes up in a state of confusion at times. Pt is compliant with her Seroquel.   Past Medical History:  Diagnosis Date  . Neuromuscular disorder [facility](HCC)    Current Outpatient Prescriptions on File Prior to Visit  Medication Sig Dispense Refill  . Diclofenac Sodium 3 % CREA Apply 1 application topically 3 (three) times daily. 2500 g 0  . donepezil (ARICEPT) 10 MG tablet Take 10 mg by mouth at bedtime.    . furosemide (LASIX) 20 MG tablet Take 1 tablet (20 mg total) by mouth daily. 30 tablet 0  . QUEtiapine (SEROQUEL) 100 MG tablet Take 100 mg by mouth at bedtime.     No current facility-administered medications on file prior to visit.    No Known Allergies   Review of Systems  Constitutional: Negative for appetite change.  Cardiovascular: Positive for leg swelling.  Gastrointestinal: Positive for abdominal distention. Negative for constipation.  Psychiatric/Behavioral: Positive for confusion (intermittent) and sleep disturbance.      Objective:   Physical Exam  Constitutional: She is oriented to person, place, and time. She appears well-developed and well-nourished. No distress.  HENT:  Head: Normocephalic and atraumatic.  Eyes: Conjunctivae and EOM are normal.  Neck: Neck supple. No tracheal deviation present.  Cardiovascular: Normal rate.   Pulmonary/Chest: Effort normal. No respiratory distress.  Musculoskeletal: Normal range of motion.  1+ pitting edema over legs and sacrum.   Neurological:  She is alert and oriented to person, place, and time.  Skin: Skin is warm and dry.  Psychiatric: She has a normal mood and affect. Her behavior is normal.  Nursing note and vitals reviewed.  BP 123/85   Pulse 83   Temp 98.5 F (36.9 C) (Oral)   Resp 18   SpO2 97%     Dg Abd 1 View  Result Date: 06/19/2016 CLINICAL DATA:  Abdominal pain, bloating,  occasional vomiting. Fecal incontinence. EXAM: ABDOMEN - 1 VIEW COMPARISON:  None. FINDINGS: The diaphragm is excluded by collimation. Nonobstructive bowel gas pattern. Moderate amount of formed stool throughout the colon consistent with constipation. No suspicious calcifications or masses seen radiographically. No evidence of organomegaly. Osseus structures are intact. IMPRESSION: Nonobstructive bowel gas pattern. Constipation. Electronically Signed   By: Ted Mcalpine M.D.   On: 06/19/2016 16:06   Assessment & Plan:  Unable to get an A1C covered. Add on a future A1C.   1. Edema, unspecified type   2. Class 1 obesity due to excess calories with serious comorbidity in adult, unspecified BMI   3. Generalized abdominal pain   4. Myalgia   5. Cognitive developmental delay   6. Cognitive impairment   7. Incontinence of feces, unspecified fecal incontinence type   8. Urinary incontinence, unspecified type   9. Transaminitis     Orders Placed This Encounter  Procedures  . DG Abd 1 View    Standing Status:   Future    Number of Occurrences:   1    Standing Expiration Date:   06/19/2017    Order Specific Question:   Reason for Exam (SYMPTOM  OR DIAGNOSIS REQUIRED)    Answer:   abdominal pain, bloating, occasional vomiting, fecal incontinence, suspect constipation    Order Specific Question:   Is the patient pregnant?    Answer:   No    Order Specific Question:   Preferred imaging location?    Answer:   External  . Comprehensive metabolic panel  . CBC  . Care order/instruction:    Scheduling Instructions:     Complete orders, AVS and go.  Marland Kitchen POCT urinalysis dipstick  . POCT Microscopic Urinalysis ([facility])    Meds ordered this encounter  Medications  . furosemide (LASIX) 40 MG tablet    Sig: Take 1 tablet (40 mg total) by mouth daily.    Dispense:  30 tablet    Refill:  2  . QUEtiapine (SEROQUEL) 200 MG tablet    Sig: Take 1 tablet (200 mg total) by mouth at bedtime.    Dispense:   30 tablet    Refill:  2  . potassium chloride (KLOR-CON) 20 MEQ packet    Sig: Take 20 mEq by mouth daily.    Dispense:  30 packet    Refill:  2  . polyethylene glycol powder (GLYCOLAX/MIRALAX) powder    Sig: Take 17 g by mouth daily.    Dispense:  500 g    Refill:  1    I personally performed the services described in this documentation, which was scribed in my presence. The recorded information has been reviewed and considered, and addended by me as needed.   Norberto Sorenson, M.D.  Primary Care at [facility] [address] [place], [place] [address] [phone] phone [phone] fax  07/17/16 8:17 AM

## 2016-06-19 NOTE — Patient Instructions (Addendum)
IF you received an x-ray today, you will receive an [facility] from [facility]. Please contact [facility] at [phone] with questions or concerns regarding your invoice.   IF you received labwork today, you will receive an invoice from [facility]. Please contact [facility] at [phone] with questions or concerns regarding your invoice.   Our billing staff will not be able to assist you with questions regarding bills from these companies.  You will be contacted with the lab results as soon as they are available. The fastest way to get your results is to activate your My Chart account. Instructions are located on the last page of this paperwork. If you have not heard from us regarding the results in 2 weeks, please contact this office.      Edema (Edema) El edema es una acumulacin anormal de lquido en los tejidos del cuerpo. En parte se debe al efecto de la gravedad que atrae el lquido a la parte ms baja del cuerpo, lo que hace que la afeccin sea ms frecuente en las piernas y los muslos (extremidades inferiores). A menudo, los pies y los tobillos se hinchan sin que Designer, jewelleryhaya dolor, y es ms probable que esto ocurra a medida que envejece. Tambin es frecuente en los tejidos ms blandos, por ejemplo, alrededor [employer] los ojos. Cuando se comprime la zona afectada, el lquido puede moverse de Nature conservation officerese lugar y es posible que quede una depresin durante unos instantes. Esta depresin se conoce como fvea. CAUSAS Hay muchas causas posibles de edema. Consumir grandes cantidades de sal y estar de pie o sentado durante mucho tiempo puede causar edema en las piernas y los tobillos. Cuando hace calor, el edema puede empeorar. Las causas mdicas frecuentes del edema incluyen:  Insuficiencia cardaca.  Enfermedad heptica.  Enfermedades renales.  Vasos sanguneos dbiles en las piernas.  Cncer.  Una lesin.  Embarazo.  Algunos medicamentos.  Obesidad. SNTOMAS Generalmente, el  edema es indoloro. La piel puede parecer hinchada o tener un aspecto brilloso. DIAGNSTICO Para poder diagnosticar el edema, el mdico le har preguntas sobre sus antecedentes mdicos y Education officer, environmentalrealizar un examen fsico. Es posible que tenga que hacerse estudios, por ejemplo, radiografas, un electrocardiograma o anlisis de sangre para Engineer, manufacturingdetectar la presencia de enfermedades que pueden causar edema. TRATAMIENTO El tratamiento del edema depende de la causa. Si tiene enfermedades cardacas, hepticas o renales, debe recibir el tratamiento adecuado para estas afecciones. El tratamiento general puede incluir:  Elevacin de la parte del cuerpo afectada por encima del nivel del corazn.  Compresin de la parte del cuerpo afectada. La presin que Verizonejercen las vendas o las medias elsticas comprimen los tejidos y [place]an el lquido de regreso a los vasos sanguneos. Esto evita que el lquido The PNC Financialentre en los tejidos.  Restriccin de la ingesta de lquido y sal.  Administracin de pldoras para orinar (diurticos). Estos medicamentos son adecuados solo para algunos tipos de edema. Extraen el lquido del organismo y [place] and Herzegovinaaumentan la frecuencia de la miccin. De este modo, se elimina el lquido y se reduce la hinchazn, pero los diurticos pueden tener efectos secundarios. Tome los diurticos solo como le indic el mdico. INSTRUCCIONES PARA EL CUIDADO EN EL Devon EnergyHOGAR  Mantenga la parte del cuerpo afectada por encima del nivel del corazn cuando se acueste.  No permanezca sentado quieto ni de pie durante perodos de tiempo prolongados.  No se ponga nada por debajo de las rodillas cuando se acueste.  No use ropa apretada ni ligas en los muslos.  Maricela CuretHaga  ejercicios con las piernas para que el lquido retorne a los vasos sanguneos. Esto puede ayudar a Building services engineer.  Use vendas o medias elsticas para reducir la hinchazn de los tobillos segn las indicaciones del mdico.  Meriel Flavors una dieta con bajo contenido de sal para  reducir el lquido si el mdico lo recomienda.  Tome los medicamentos solamente como se lo haya indicado el mdico. SOLICITE ATENCIN MDICA SI:  El edema no responde al tratamiento.  Tiene enfermedades cardacas, hepticas o renales y observa sntomas de edema.  Tiene edema en las piernas que no mejora despus de haberlas elevado.  Aumenta de peso de [place] repentina y sin motivo aparente. SOLICITE ATENCIN MDICA DE INMEDIATO SI:  Siente falta de aire o dolor en el pecho.  No puede respirar cuando se acuesta.  Tiene dolor, enrojecimiento o calor en las zonas hinchadas.  Tiene enfermedades cardacas, hepticas o renales y repentinamente tiene edema.  Tiene fiebre y los sntomas empeoran repentinamente. ASEGRESE DE QUE:  Comprende estas instrucciones.  Controlar su afeccin.  Recibir ayuda de inmediato si no mejora o si empeora. Esta informacin no tiene Theme park manager el consejo del mdico. Asegrese de hacerle al mdico cualquier pregunta que tenga. Document Released: 03/22/2005 Document Revised: 07/14/2015 Document Reviewed: 01/12/2013 Elsevier Interactive Patient Education  2017 [employer].

## 2016-06-20 LAB — COMPREHENSIVE METABOLIC PANEL
ALT: 37 IU/L — AB (ref 0–32)
AST: 24 IU/L (ref 0–40)
Albumin/Globulin Ratio: 1.3 (ref 1.2–2.2)
Albumin: 3.9 g/dL (ref 3.5–5.5)
Alkaline Phosphatase: 102 IU/L (ref 39–117)
BUN/Creatinine Ratio: 9 (ref 9–23)
BUN: 8 mg/dL (ref 6–24)
Bilirubin Total: <0.2 mg/dL (ref 0.0–1.2)
CALCIUM: 8.8 mg/dL (ref 8.7–10.2)
CO2: 22 mmol/L (ref 18–29)
Chloride: 101 mmol/L (ref 96–106)
Creatinine, Ser: 0.87 mg/dL (ref 0.57–1.00)
GFR, EST AFRICAN AMERICAN: 93 mL/min/{1.73_m2} (ref >59)
GFR, EST NON AFRICAN AMERICAN: 81 mL/min/{1.73_m2} (ref >59)
GLUCOSE: 102 mg/dL — AB (ref 65–99)
Globulin, Total: 3.1 g/dL (ref 1.5–4.5)
Potassium: 4.1 mmol/L (ref 3.5–5.2)
Sodium: 140 mmol/L (ref 134–144)
TOTAL PROTEIN: 7 g/dL (ref 6.0–8.5)

## 2016-06-20 LAB — CBC
HEMATOCRIT: 39.5 % (ref 34.0–46.6)
HEMOGLOBIN: 12.6 g/dL (ref 11.1–15.9)
MCH: 27.8 pg (ref 26.6–33.0)
MCHC: 31.9 g/dL (ref 31.5–35.7)
MCV: 87 fL (ref 79–97)
Platelets: 371 10*3/uL (ref 150–379)
RBC: 4.53 x10E6/uL (ref 3.77–5.28)
RDW: 15.7 % — ABNORMAL HIGH (ref 12.3–15.4)
WBC: 11.2 10*3/uL — ABNORMAL HIGH (ref 3.4–10.8)

## 2016-06-22 ENCOUNTER — Telehealth: Payer: Self-pay | Admitting: Emergency Medicine

## 2016-06-22 NOTE — Telephone Encounter (Signed)
-----   Message from Sherren MochaEva N Shaw, MD sent at 06/20/2016  1:07 AM EDT ----- Need to call with Spanish translator. Pt is constipated.  Make sure she drinks a dose of the polyethylene glycol powder - mix a capful of the powder into any clear liquid and take daily - likely permanently - to treat constipation and so it doesn't recur. This is causing the retention, bloating, and occasional vomiting.  Increase water she is drinking and less juice and lemonade.

## 2016-07-10 ENCOUNTER — Ambulatory Visit: Payer: Medicare Other

## 2016-07-17 ENCOUNTER — Ambulatory Visit (INDEPENDENT_AMBULATORY_CARE_PROVIDER_SITE_OTHER): Payer: Medicare Other | Admitting: Family Medicine

## 2016-07-17 VITALS — BP 122/81 | HR 88 | Temp 97.8°F | Resp 18

## 2016-07-17 DIAGNOSIS — R0609 Other forms of dyspnea: Secondary | ICD-10-CM | POA: Diagnosis not present

## 2016-07-17 DIAGNOSIS — K5900 Constipation, unspecified: Secondary | ICD-10-CM

## 2016-07-17 DIAGNOSIS — G709 Myoneural disorder, unspecified: Secondary | ICD-10-CM

## 2016-07-17 DIAGNOSIS — R32 Unspecified urinary incontinence: Secondary | ICD-10-CM | POA: Diagnosis not present

## 2016-07-17 DIAGNOSIS — Z5181 Encounter for therapeutic drug level monitoring: Secondary | ICD-10-CM | POA: Diagnosis not present

## 2016-07-17 DIAGNOSIS — R069 Unspecified abnormalities of breathing: Secondary | ICD-10-CM | POA: Diagnosis not present

## 2016-07-17 DIAGNOSIS — R609 Edema, unspecified: Secondary | ICD-10-CM | POA: Diagnosis not present

## 2016-07-17 DIAGNOSIS — R7309 Other abnormal glucose: Secondary | ICD-10-CM | POA: Diagnosis not present

## 2016-07-17 DIAGNOSIS — R4189 Other symptoms and signs involving cognitive functions and awareness: Secondary | ICD-10-CM | POA: Diagnosis not present

## 2016-07-17 DIAGNOSIS — R5381 Other malaise: Secondary | ICD-10-CM

## 2016-07-17 DIAGNOSIS — R150 Incomplete defecation: Secondary | ICD-10-CM | POA: Diagnosis not present

## 2016-07-17 DIAGNOSIS — R262 Difficulty in walking, not elsewhere classified: Secondary | ICD-10-CM

## 2016-07-17 MED ORDER — AZELASTINE HCL 0.05 % OP SOLN: 1.0000 [drp] | Freq: Two times a day (BID) | OPHTHALMIC | 2 refills | Status: AC

## 2016-07-17 MED ORDER — FUROSEMIDE 40 MG PO TABS: 40.0000 mg | ORAL_TABLET | Freq: Two times a day (BID) | ORAL | 1 refills | Status: DC

## 2016-07-17 MED ORDER — POTASSIUM CHLORIDE 20 MEQ PO PACK: 20.0000 meq | PACK | Freq: Every day | ORAL | 1 refills | Status: DC

## 2016-07-17 NOTE — Patient Instructions (Addendum)
     IF you received an x-ray today, you will receive an invoice from [facility]. Please contact [facility] at [phone] with questions or concerns regarding your invoice.   IF you received labwork today, you will receive an invoice from [facility]. Please contact [facility] at [phone] with questions or concerns regarding your invoice.   Our billing staff will not be able to assist you with questions regarding bills from these companies.  You will be contacted with the lab results as soon as they are available. The fastest way to get your results is to activate your My Chart account. Instructions are located on the last page of this paperwork. If you have not heard from us regarding the results in 2 weeks, please contact this office.      Peripheral Edema Peripheral edema is swelling that is caused by a buildup of fluid. Peripheral edema most often affects the lower legs, ankles, and feet. It can also develop in the arms, hands, and face. The area of the body that has peripheral edema will look swollen. It may also feel heavy or warm. Your clothes may start to feel tight. Pressing on the area may make a temporary dent in your skin. You may not be able to move your arm or leg as much as usual. There are many causes of peripheral edema. It can be a complication of other diseases, such as congestive heart failure, kidney disease, or a problem with your blood circulation. It also can be a side effect of certain medicines. It often happens to women during pregnancy. Sometimes, the cause is not known. Treating the underlying condition is often the only treatment for peripheral edema. Follow these instructions at home: Pay attention to any changes in your symptoms. Take these actions to help with your discomfort:  Raise (elevate) your legs while you are sitting or lying down.  Move around often to prevent stiffness and to lessen swelling. Do not sit or stand for long periods of  time.  Wear support stockings as told by your health care provider.  Follow instructions from your health care provider about limiting salt (sodium) in your diet. Sometimes eating less salt can reduce swelling.  Take over-the-counter and prescription medicines only as told by your health care provider. Your health care provider may prescribe medicine to help your body get rid of excess water (diuretic).  Keep all follow-up visits as told by your health care provider. This is important. Contact a health care provider if:  You have a fever.  Your edema starts suddenly or is getting worse, especially if you are pregnant or have a medical condition.  You have swelling in only one leg.  You have increased swelling and pain in your legs. Get help right away if:  You develop shortness of breath, especially when you are lying down.  You have pain in your chest or abdomen.  You feel weak.  You faint. This information is not intended to replace advice given to you by your health care provider. Make sure you discuss any questions you have with your health care provider. Document Released: 04/29/2004 Document Revised: 08/25/2015 Document Reviewed: 10/02/2014 Elsevier Interactive Patient Education  2017 Elsevier Inc.  

## 2016-07-17 NOTE — Progress Notes (Signed)
By signing my name below, I, Mesha Guinyard, attest that this documentation has been prepared under the direction and in the presence of Delman Cheadle, MD.  Electronically Signed: Verlee Monte, Medical Scribe. 07/17/16. 8:16 AM.  Subjective:    Patient ID: Melissa Stewart, female    DOB: 08-19-1970, 46 y.o.   MRN: [id number]  HPI Chief Complaint  Patient presents with  . Follow-up    recheck from 06/19/16 (edema)    HPI Comments: Melissa Stewart is a 46 y.o. 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 I first met her 2 months ago so do not have access to any pertinent medical history. She was being cared in remarkably poor conditions before being "rescued" by her current family members 2 months prior. At last visit, she had increased extremity edema and abdominal distention. She's incontinence at urine and bowel at base line. Through x-ray we determined she was constipated and not consuming much water. She also was noted to be babbling in her sleep and increased confusion at night. Therefore increased pt seroquel from 100 to 200 mg, I started a miralax clean out and added lasix with potassium daily. Unable to obtain a urine sample due to incontinence. Labs with CBC with a mild leukocytosis, and CMP nl with glucose 102. 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 Althought the brain CT showed possible prior infection, i.e. cysticercosis, there is not edema found- CSF was nl. So sxs were thought to be iatrogenic due to possible side effects from ciprofloxacin and phenergan which was rx'ed previously. She was seen by psychology due to agitation and increased anxiety. She was placed on 100 mg of seroquel. At that time she was discharged on no other medications other than seroquel.   Pt's native language is Spanish so stratus interpreter [phone] was  used, but mainly family members were used. Reports her knee edema is worsening, has a intermittent cough that hasn't went away and her eyes water. In the past she would take small steps for exercise, but recently, she doesn't want to get up. Pt used to have a PT in the past but they stopped coming. Pt needs someone to speak to her and coax her into doing her exercise. Pt uses compression socks and compliant with lasix for relief of her sxs. Pt accidentally scratched herself and her caretakers have cut her nails back to prevent abrasions. Pt has been sleeping a lot better than she used too. She still talks in her sleep and sleeps in her back without difficulty. Pt reports she eats well and doesn't have any issues eating. She does not eat much salt in the day. Pt doesn't like to drink water outside of the home due to bowel incontinence and she wears adult diapers. Her caretakers reports she doesn't like to drink water where ever she is. Pt is compliant with miralax and has a bowel movement 1x a day - soft stool, not liquid. Pt was born, grew up, and married without the cognitive and neuromuscular disorder. 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 Pt married in her 70s and it "went downhill from there" - pt has no children. Pt stopped walking 2 years ago and she now lives at her parents home since there is someone always there.  Pt was seen  by [facility] neurology and has an appt 07/24/16. 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 They also suspect it will get worse and her caretakers reports pt's brother is showing sxs of the same disorder. Denies abdominal pain, and bed sores.   Melissa Stewart is here today with her family who provides the hx. Melissa Stewart is able to contribute short statements such as a refusal to drink water (because it makes her wet), refusal of blood draw (states it was done last time), and refusal to attempt to  urinate into the hat for a urinalysis (can't). Melissa Stewart is her sister who speaks excellent English and provides most of the history and notes that Melissa Stewart is often very stubborn about things but often with persistence and patience she can be brought around. Her sister-in law who cares for her is also here today and is Spanish speaking.  She lives with her parents - older adults, very kind, who I had the pleasure of meeting at our last visit.   Patient Active Problem List   Diagnosis Date Noted  . 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 Cognitive impairment 06/19/2016   Past Medical History:  Diagnosis Date  . Neuromuscular disorder ([place])    No past surgical history on file. No Known Allergies Prior to Admission medications   Medication Sig Start Date End Date Taking? Authorizing Provider  Diclofenac Sodium 3 % CREA Apply 1 application topically 3 (three) times daily. 05/15/16  Yes Shawnee Knapp, MD  donepezil (ARICEPT) 10 MG tablet Take 10 mg by mouth at bedtime.   Yes Historical Provider, MD  furosemide (LASIX) 40 MG tablet Take 1 tablet (40 mg total) by mouth daily. 06/19/16  Yes Shawnee Knapp, MD  polyethylene glycol powder (GLYCOLAX/MIRALAX) powder Take 17 g by mouth daily. 06/19/16  Yes Shawnee Knapp, MD  potassium chloride (KLOR-CON) 20 MEQ packet Take 20 mEq by mouth daily. 06/19/16  Yes Shawnee Knapp, MD  QUEtiapine (SEROQUEL) 200 MG tablet Take 1 tablet (200 mg total) by mouth at bedtime. 06/19/16  Yes Shawnee Knapp, MD   Social History   Social History  . Marital status: Married    Spouse name: N/A  . Number of children: N/A  . Years of education: N/A   Occupational History  . Not on file.   Social History Main Topics  . Smoking status: Never Smoker  . Smokeless tobacco: Never Used  . Alcohol use No  . Drug use: No  . Sexual activity: No   Other Topics Concern  . Not on file   Social History Narrative  . No narrative on file   Review of Systems  Respiratory: Positive for cough.    Gastrointestinal: Negative for abdominal pain.  Musculoskeletal: Negative for arthralgias and myalgias.  Psychiatric/Behavioral: Negative for sleep disturbance.   Objective:  Physical Exam  Constitutional: She appears well-developed and well-nourished. No distress.  HENT:  Head: Normocephalic and atraumatic.  Right Ear: Tympanic membrane normal.  Left Ear: Tympanic membrane normal.  Nose: Nose normal.  Mouth/Throat: Oropharynx is clear and moist.  Eyes: Conjunctivae are normal.  Mild injection of the medial aspect of the left conjuntiva  Neck: Neck supple.  Cardiovascular: Normal rate, regular rhythm and normal heart sounds.  Exam reveals no gallop and no friction rub.   No murmur heard. Pulses:      Radial pulses are 2+ on the right side, and 2+ on the left side.  Pulmonary/Chest: Effort normal. No respiratory  distress. She has decreased breath sounds (especially at bases) in the right lower field and the left lower field. She has no wheezes. She has no rhonchi. She has no rales.  Lungs clear  Abdominal: There is no tenderness.  Musculoskeletal:  2+ pitting edema at the knees 1+ pitting edema at the sacral region L-spine TTP  Neurological: She is alert.  Skin: Skin is warm and dry.  Psychiatric: She has a normal mood and affect. Her behavior is normal.  Nursing note and vitals reviewed.  BP 122/81   Pulse 88   Temp 97.8 F (36.6 C) (Oral)   Resp 18   SpO2 97%  Assessment & Plan:    1. Peripheral edema - Recommend EKG at f/u to w/u persistent peripheral edema unresponsive to compression hose and lasix 40 qd - per last note, seems it has worsened. Rec additional labs with a1c, thyroid panel, bmp, bnp but pt refuses lab draw - gave rx to get done at urology appt later this mo. Increase lasix and K to bid   2. Medication monitoring encounter   3. Elevated glucose   4. Constipation, unspecified constipation type - cont miralax qd  5. Incomplete defecation   6. Urinary  incontinence, unspecified type  - h/o UTI, need to make sure this isn't causing her worsening weakness/debility but pt is incontinent and refuses to try to give urine sample today. Sent home with hat and future orders. If this doesn't work, try a bag collection at next [place] (put on at the beginning, then get EKG, etc to hopefully collect sample by end of visit.  7. Dyspnea on exertion - assume due to deconditioning, no orthopnea  8. Abnormal breathing   9. Debility- Progressing - new inability to help transfer herself.  Sev mos ago she was able to take a few steps with assist but not now. Refer to home health for PT which Melissa Stewart has done in years prior - Melissa Stewart reports that when the PT is patient and persistent it is REALLY helpful because Melissa Stewart will refuse to do most things at first but be talked into it. Also preferably Spanish speaking.  Will get neurology records, pt has a progressive debilitating disease that is affecting her neuromuscular system and cognition  10. Cannot walk   Sleep improved on after increasing seroquel from 100 to 235m qd.  Orders Placed This Encounter  Procedures  . Urine culture    Standing Status:   Future    Standing Expiration Date:   07/17/2017  . POCT urinalysis dipstick    Standing Status:   Future    Standing Expiration Date:   01/16/2017  . POCT Microscopic Urinalysis ([facility])    Standing Status:   Future    Standing Expiration Date:   01/16/2017    Meds ordered this encounter  Medications  . azelastine (OPTIVAR) 0.05 % ophthalmic solution    Sig: Place 1 drop into the left eye 2 (two) times daily. For eye redness and drainage    Dispense:  6 mL    Refill:  2  . furosemide (LASIX) 40 MG tablet    Sig: Take 1 tablet (40 mg total) by mouth 2 (two) times daily.    Dispense:  60 tablet    Refill:  1  . potassium chloride (KLOR-CON) 20 MEQ packet    Sig: Take 20 mEq by mouth daily.    Dispense:  60 packet    Refill:  1   Over 40 min spent  in face-to-face  evaluation of and consultation with patient and coordination of care.  Over 50% of this time was spent counseling this patient.  I personally performed the services described in this documentation, which was scribed in my presence. The recorded information has been reviewed and considered, and addended by me as needed.   Delman Cheadle, M.D.  Primary Care at [facility] [address] [place], [place] [address] [phone] phone [phone] fax  07/18/16 8:29 AM

## 2016-07-18 ENCOUNTER — Encounter: Payer: Self-pay | Admitting: Family Medicine

## 2016-07-21 DIAGNOSIS — M6281 Muscle weakness (generalized): Secondary | ICD-10-CM | POA: Diagnosis not present

## 2016-07-21 DIAGNOSIS — F329 Major depressive disorder, single episode, unspecified: Secondary | ICD-10-CM | POA: Diagnosis not present

## 2016-07-21 DIAGNOSIS — R413 Other amnesia: Secondary | ICD-10-CM | POA: Diagnosis not present

## 2016-07-21 DIAGNOSIS — F209 Schizophrenia, unspecified: Secondary | ICD-10-CM | POA: Diagnosis not present

## 2016-07-21 DIAGNOSIS — F419 Anxiety disorder, unspecified: Secondary | ICD-10-CM | POA: Diagnosis not present

## 2016-07-21 DIAGNOSIS — G3289 Other specified degenerative disorders of nervous system in diseases classified elsewhere: Secondary | ICD-10-CM | POA: Diagnosis not present

## 2016-07-23 ENCOUNTER — Other Ambulatory Visit: Payer: Self-pay | Admitting: *Deleted

## 2016-07-23 DIAGNOSIS — R32 Unspecified urinary incontinence: Secondary | ICD-10-CM | POA: Diagnosis not present

## 2016-07-23 LAB — POCT URINALYSIS DIP (MANUAL ENTRY)
BILIRUBIN UA: NEGATIVE mg/dL
Bilirubin, UA: NEGATIVE
GLUCOSE UA: NEGATIVE mg/dL
Nitrite, UA: NEGATIVE
PROTEIN UA: NEGATIVE mg/dL
SPEC GRAV UA: 1.025 (ref 1.010–1.025)
Urobilinogen, UA: 0.2 E.U./dL
pH, UA: 5.5 (ref 5.0–8.0)

## 2016-07-23 LAB — POC MICROSCOPIC URINALYSIS (UMFC): MUCUS RE: ABSENT

## 2016-07-23 NOTE — Addendum Note (Signed)
Addended by: Isaac Bliss on: 07/23/2016 05:30 PM   Modules accepted: Orders

## 2016-07-24 LAB — URINE CULTURE

## 2016-07-27 DIAGNOSIS — G3289 Other specified degenerative disorders of nervous system in diseases classified elsewhere: Secondary | ICD-10-CM | POA: Diagnosis not present

## 2016-07-27 DIAGNOSIS — F329 Major depressive disorder, single episode, unspecified: Secondary | ICD-10-CM | POA: Diagnosis not present

## 2016-07-27 DIAGNOSIS — M6281 Muscle weakness (generalized): Secondary | ICD-10-CM | POA: Diagnosis not present

## 2016-07-27 DIAGNOSIS — F419 Anxiety disorder, unspecified: Secondary | ICD-10-CM | POA: Diagnosis not present

## 2016-07-27 DIAGNOSIS — R413 Other amnesia: Secondary | ICD-10-CM | POA: Diagnosis not present

## 2016-07-27 DIAGNOSIS — F209 Schizophrenia, unspecified: Secondary | ICD-10-CM | POA: Diagnosis not present

## 2016-07-28 DIAGNOSIS — R413 Other amnesia: Secondary | ICD-10-CM | POA: Diagnosis not present

## 2016-07-28 DIAGNOSIS — Z789 Other specified health status: Secondary | ICD-10-CM | POA: Diagnosis not present

## 2016-07-28 DIAGNOSIS — Z719 Counseling, unspecified: Secondary | ICD-10-CM | POA: Diagnosis not present

## 2016-07-29 DIAGNOSIS — G3289 Other specified degenerative disorders of nervous system in diseases classified elsewhere: Secondary | ICD-10-CM | POA: Diagnosis not present

## 2016-07-29 DIAGNOSIS — M6281 Muscle weakness (generalized): Secondary | ICD-10-CM | POA: Diagnosis not present

## 2016-07-29 DIAGNOSIS — F419 Anxiety disorder, unspecified: Secondary | ICD-10-CM | POA: Diagnosis not present

## 2016-07-29 DIAGNOSIS — R413 Other amnesia: Secondary | ICD-10-CM | POA: Diagnosis not present

## 2016-07-29 DIAGNOSIS — F209 Schizophrenia, unspecified: Secondary | ICD-10-CM | POA: Diagnosis not present

## 2016-07-29 DIAGNOSIS — F329 Major depressive disorder, single episode, unspecified: Secondary | ICD-10-CM | POA: Diagnosis not present

## 2016-08-02 ENCOUNTER — Ambulatory Visit: Payer: Medicare Other | Admitting: Family Medicine

## 2016-08-03 DIAGNOSIS — F419 Anxiety disorder, unspecified: Secondary | ICD-10-CM | POA: Diagnosis not present

## 2016-08-03 DIAGNOSIS — G3289 Other specified degenerative disorders of nervous system in diseases classified elsewhere: Secondary | ICD-10-CM | POA: Diagnosis not present

## 2016-08-03 DIAGNOSIS — R413 Other amnesia: Secondary | ICD-10-CM | POA: Diagnosis not present

## 2016-08-03 DIAGNOSIS — F209 Schizophrenia, unspecified: Secondary | ICD-10-CM | POA: Diagnosis not present

## 2016-08-03 DIAGNOSIS — M6281 Muscle weakness (generalized): Secondary | ICD-10-CM | POA: Diagnosis not present

## 2016-08-03 DIAGNOSIS — F329 Major depressive disorder, single episode, unspecified: Secondary | ICD-10-CM | POA: Diagnosis not present

## 2016-08-06 DIAGNOSIS — M6281 Muscle weakness (generalized): Secondary | ICD-10-CM | POA: Diagnosis not present

## 2016-08-06 DIAGNOSIS — F419 Anxiety disorder, unspecified: Secondary | ICD-10-CM | POA: Diagnosis not present

## 2016-08-06 DIAGNOSIS — F209 Schizophrenia, unspecified: Secondary | ICD-10-CM | POA: Diagnosis not present

## 2016-08-06 DIAGNOSIS — G3289 Other specified degenerative disorders of nervous system in diseases classified elsewhere: Secondary | ICD-10-CM | POA: Diagnosis not present

## 2016-08-06 DIAGNOSIS — R413 Other amnesia: Secondary | ICD-10-CM | POA: Diagnosis not present

## 2016-08-06 DIAGNOSIS — F329 Major depressive disorder, single episode, unspecified: Secondary | ICD-10-CM | POA: Diagnosis not present

## 2016-08-10 DIAGNOSIS — F209 Schizophrenia, unspecified: Secondary | ICD-10-CM | POA: Diagnosis not present

## 2016-08-10 DIAGNOSIS — F329 Major depressive disorder, single episode, unspecified: Secondary | ICD-10-CM | POA: Diagnosis not present

## 2016-08-10 DIAGNOSIS — R413 Other amnesia: Secondary | ICD-10-CM | POA: Diagnosis not present

## 2016-08-10 DIAGNOSIS — F419 Anxiety disorder, unspecified: Secondary | ICD-10-CM | POA: Diagnosis not present

## 2016-08-10 DIAGNOSIS — G3289 Other specified degenerative disorders of nervous system in diseases classified elsewhere: Secondary | ICD-10-CM | POA: Diagnosis not present

## 2016-08-10 DIAGNOSIS — M6281 Muscle weakness (generalized): Secondary | ICD-10-CM | POA: Diagnosis not present

## 2016-08-12 DIAGNOSIS — F419 Anxiety disorder, unspecified: Secondary | ICD-10-CM | POA: Diagnosis not present

## 2016-08-12 DIAGNOSIS — G3289 Other specified degenerative disorders of nervous system in diseases classified elsewhere: Secondary | ICD-10-CM | POA: Diagnosis not present

## 2016-08-12 DIAGNOSIS — R413 Other amnesia: Secondary | ICD-10-CM | POA: Diagnosis not present

## 2016-08-12 DIAGNOSIS — M6281 Muscle weakness (generalized): Secondary | ICD-10-CM | POA: Diagnosis not present

## 2016-08-12 DIAGNOSIS — F209 Schizophrenia, unspecified: Secondary | ICD-10-CM | POA: Diagnosis not present

## 2016-08-12 DIAGNOSIS — F329 Major depressive disorder, single episode, unspecified: Secondary | ICD-10-CM | POA: Diagnosis not present

## 2016-08-17 DIAGNOSIS — F419 Anxiety disorder, unspecified: Secondary | ICD-10-CM | POA: Diagnosis not present

## 2016-08-17 DIAGNOSIS — R413 Other amnesia: Secondary | ICD-10-CM | POA: Diagnosis not present

## 2016-08-17 DIAGNOSIS — M6281 Muscle weakness (generalized): Secondary | ICD-10-CM | POA: Diagnosis not present

## 2016-08-17 DIAGNOSIS — F329 Major depressive disorder, single episode, unspecified: Secondary | ICD-10-CM | POA: Diagnosis not present

## 2016-08-17 DIAGNOSIS — G3289 Other specified degenerative disorders of nervous system in diseases classified elsewhere: Secondary | ICD-10-CM | POA: Diagnosis not present

## 2016-08-17 DIAGNOSIS — F209 Schizophrenia, unspecified: Secondary | ICD-10-CM | POA: Diagnosis not present

## 2016-08-18 DIAGNOSIS — M6281 Muscle weakness (generalized): Secondary | ICD-10-CM | POA: Diagnosis not present

## 2016-08-18 DIAGNOSIS — R413 Other amnesia: Secondary | ICD-10-CM | POA: Diagnosis not present

## 2016-08-18 DIAGNOSIS — F329 Major depressive disorder, single episode, unspecified: Secondary | ICD-10-CM | POA: Diagnosis not present

## 2016-08-18 DIAGNOSIS — F419 Anxiety disorder, unspecified: Secondary | ICD-10-CM | POA: Diagnosis not present

## 2016-08-18 DIAGNOSIS — F209 Schizophrenia, unspecified: Secondary | ICD-10-CM | POA: Diagnosis not present

## 2016-08-18 DIAGNOSIS — G3289 Other specified degenerative disorders of nervous system in diseases classified elsewhere: Secondary | ICD-10-CM | POA: Diagnosis not present

## 2016-09-03 ENCOUNTER — Telehealth: Payer: Self-pay | Admitting: Family Medicine

## 2016-09-03 DIAGNOSIS — N3942 Incontinence without sensory awareness: Secondary | ICD-10-CM

## 2016-09-03 DIAGNOSIS — Z993 Dependence on wheelchair: Secondary | ICD-10-CM | POA: Insufficient documentation

## 2016-09-03 DIAGNOSIS — M6281 Muscle weakness (generalized): Secondary | ICD-10-CM | POA: Insufficient documentation

## 2016-09-03 DIAGNOSIS — G3289 Other specified degenerative disorders of nervous system in diseases classified elsewhere: Secondary | ICD-10-CM

## 2016-09-03 DIAGNOSIS — R32 Unspecified urinary incontinence: Secondary | ICD-10-CM | POA: Insufficient documentation

## 2016-09-03 NOTE — Telephone Encounter (Signed)
Received home health orders which state pt has a dx of schizophrenia F20.9, major depression F32.9, amnesia R41.3, anxiety F41.9, hypothyroidism E03.9????   Unaware of any of this prior pt hx so will need to discuss at next visit as well as check tsh.

## 2016-09-25 ENCOUNTER — Other Ambulatory Visit: Payer: Self-pay | Admitting: Family Medicine

## 2016-10-07 ENCOUNTER — Other Ambulatory Visit: Payer: Self-pay | Admitting: Family Medicine

## 2016-10-07 NOTE — Telephone Encounter (Signed)
Pt is long-overdue for a f/u OV.  Pt is disabled so you will speak to her caregivers. Please have her sched an appt within the next 2 months so we can continue to make sure the medicines are keeping her healthy and safe before we refill them further. Thanks.

## 2016-10-08 ENCOUNTER — Telehealth: Payer: Self-pay | Admitting: Family Medicine

## 2016-10-08 NOTE — Telephone Encounter (Signed)
UNABLE TO LEAVE MESSAGE I WILL GET Melissa Stewart TO SEND A LETTER OUT TO SET UP AN OV FOR MEDICATION

## 2016-10-08 NOTE — Telephone Encounter (Signed)
Will call patient about making an appt

## 2016-10-20 ENCOUNTER — Other Ambulatory Visit: Payer: Self-pay | Admitting: Family Medicine

## 2016-10-28 ENCOUNTER — Encounter: Payer: Self-pay | Admitting: Family Medicine

## 2016-10-28 ENCOUNTER — Ambulatory Visit (INDEPENDENT_AMBULATORY_CARE_PROVIDER_SITE_OTHER): Payer: Medicare Other | Admitting: Family Medicine

## 2016-10-28 VITALS — BP 110/64 | HR 84 | Temp 98.0°F | Resp 18

## 2016-10-28 DIAGNOSIS — Z5181 Encounter for therapeutic drug level monitoring: Secondary | ICD-10-CM

## 2016-10-28 DIAGNOSIS — N3942 Incontinence without sensory awareness: Secondary | ICD-10-CM | POA: Diagnosis not present

## 2016-10-28 DIAGNOSIS — R1310 Dysphagia, unspecified: Secondary | ICD-10-CM | POA: Diagnosis not present

## 2016-10-28 DIAGNOSIS — K5909 Other constipation: Secondary | ICD-10-CM | POA: Diagnosis not present

## 2016-10-28 DIAGNOSIS — R4189 Other symptoms and signs involving cognitive functions and awareness: Secondary | ICD-10-CM | POA: Diagnosis not present

## 2016-10-28 DIAGNOSIS — R7309 Other abnormal glucose: Secondary | ICD-10-CM | POA: Diagnosis not present

## 2016-10-28 DIAGNOSIS — R609 Edema, unspecified: Secondary | ICD-10-CM | POA: Diagnosis not present

## 2016-10-28 DIAGNOSIS — G709 Myoneural disorder, unspecified: Secondary | ICD-10-CM

## 2016-10-28 DIAGNOSIS — D72829 Elevated white blood cell count, unspecified: Secondary | ICD-10-CM | POA: Diagnosis not present

## 2016-10-28 DIAGNOSIS — Z993 Dependence on wheelchair: Secondary | ICD-10-CM

## 2016-10-28 DIAGNOSIS — M6281 Muscle weakness (generalized): Secondary | ICD-10-CM

## 2016-10-28 DIAGNOSIS — R0602 Shortness of breath: Secondary | ICD-10-CM

## 2016-10-28 MED ORDER — QUETIAPINE FUMARATE 200 MG PO TABS: 200.0000 mg | ORAL_TABLET | Freq: Every day | ORAL | 1 refills | Status: DC

## 2016-10-28 MED ORDER — POTASSIUM CHLORIDE 20 MEQ PO PACK: 20.0000 meq | PACK | Freq: Every day | ORAL | 1 refills | Status: DC

## 2016-10-28 MED ORDER — KETOCONAZOLE 2 % EX SHAM: 1.0000 "application " | MEDICATED_SHAMPOO | Freq: Every day | CUTANEOUS | 2 refills | Status: DC

## 2016-10-28 MED ORDER — FUROSEMIDE 40 MG PO TABS: 40.0000 mg | ORAL_TABLET | Freq: Two times a day (BID) | ORAL | 1 refills | Status: DC

## 2016-10-28 NOTE — Patient Instructions (Addendum)
   IF you received an x-ray today, you will receive an invoice from [facility]. Please contact [facility] at [phone] with questions or concerns regarding your invoice.   IF you received labwork today, you will receive an invoice from [facility]. Please contact [facility] at [phone] with questions or concerns regarding your invoice.   Our billing staff will not be able to assist you with questions regarding bills from these companies.  You will be contacted with the lab results as soon as they are available. The fastest way to get your results is to activate your My Chart account. Instructions are located on the last page of this paperwork. If you have not heard from us regarding the results in 2 weeks, please contact this office.     Potassium Content of Foods Potassium is a mineral found in many foods and drinks. It helps keep fluids and minerals balanced in your body and affects how steadily your heart beats. Potassium also helps control your blood pressure and keep your muscles and nervous system healthy. Certain health conditions and medicines may change the balance of potassium in your body. When this happens, you can help balance your level of potassium through the foods that you do or do not eat. Your health care provider or dietitian may recommend an amount of potassium that you should have each day. The following lists of foods provide the amount of potassium (in parentheses) per serving in each item. High in potassium The following foods and beverages have 200 mg or more of potassium per serving:  Apricots, 2 raw or 5 dry (200 mg).  Artichoke, 1 medium (345 mg).  Avocado, raw,  each (245 mg).  Banana, 1 medium (425 mg).  Beans, lima, or baked beans, canned,  cup (280 mg).  Beans, white, canned,  cup (595 mg).  Beef roast, 3 oz (320 mg).  Beef, ground, 3 oz (270 mg).  Beets, raw or cooked,  cup (260 mg).  Bran muffin, 2 oz (300  mg).  Broccoli,  cup (230 mg).  Brussels sprouts,  cup (250 mg).  Cantaloupe,  cup (215 mg).  Cereal, 100% bran,  cup (200-400 mg).  Cheeseburger, single, fast food, 1 each (225-400 mg).  Chicken, 3 oz (220 mg).  Clams, canned, 3 oz (535 mg).  Crab, 3 oz (225 mg).  Dates, 5 each (270 mg).  Dried beans and peas,  cup (300-475 mg).  Figs, dried, 2 each (260 mg).  Fish: halibut, tuna, cod, snapper, 3 oz (480 mg).  Fish: salmon, haddock, swordfish, perch, 3 oz (300 mg).  Fish, tuna, canned 3 oz (200 mg).  French fries, fast food, 3 oz (470 mg).  Granola with fruit and nuts,  cup (200 mg).  Grapefruit juice,  cup (200 mg).  Greens, beet,  cup (655 mg).  Honeydew melon,  cup (200 mg).  Kale, raw, 1 cup (300 mg).  Kiwi, 1 medium (240 mg).  Kohlrabi, rutabaga, parsnips,  cup (280 mg).  Lentils,  cup (365 mg).  Mango, 1 each (325 mg).  Milk, chocolate, 1 cup (420 mg).  Milk: nonfat, low-fat, whole, buttermilk, 1 cup (350-380 mg).  Molasses, 1 Tbsp (295 mg).  Mushrooms,  cup (280) mg.  Nectarine, 1 each (275 mg).  Nuts: almonds, peanuts, hazelnuts, [place], cashew, mixed, 1 oz (200 mg).  Nuts, pistachios, 1 oz (295 mg).  Orange, 1 each (240 mg).  Orange juice,  cup (235 mg).  Papaya, medium,  fruit (390 mg).  Peanut   butter, chunky, 2 Tbsp (240 mg).  Peanut butter, smooth, 2 Tbsp (210 mg).  Pear, 1 medium (200 mg).  Pomegranate, 1 whole (400 mg).  Pomegranate juice,  cup (215 mg).  Pork, 3 oz (350 mg).  Potato chips, salted, 1 oz (465 mg).  Potato, baked with skin, 1 medium (925 mg).  Potatoes, boiled,  cup (255 mg).  Potatoes, mashed,  cup (330 mg).  Prune juice,  cup (370 mg).  Prunes, 5 each (305 mg).  Pudding, chocolate,  cup (230 mg).  Pumpkin, canned,  cup (250 mg).  Raisins, seedless,  cup (270 mg).  Seeds, sunflower or pumpkin, 1 oz (240 mg).  Soy milk, 1 cup (300 mg).  Spinach,  cup (420  mg).  Spinach, canned,  cup (370 mg).  Sweet potato, baked with skin, 1 medium (450 mg).  Swiss chard,  cup (480 mg).  Tomato or vegetable juice,  cup (275 mg).  Tomato sauce or puree,  cup (400-550 mg).  Tomato, raw, 1 medium (290 mg).  Tomatoes, canned,  cup (200-300 mg).  Turkey, 3 oz (250 mg).  Wheat germ, 1 oz (250 mg).  Winter squash,  cup (250 mg).  Yogurt, plain or fruited, 6 oz (260-435 mg).  Zucchini,  cup (220 mg).  Moderate in potassium The following foods and beverages have 50-200 mg of potassium per serving:  Apple, 1 each (150 mg).  Apple juice,  cup (150 mg).  Applesauce,  cup (90 mg).  Apricot nectar,  cup (140 mg).  Asparagus, small spears,  cup or 6 spears (155 mg).  Bagel, cinnamon raisin, 1 each (130 mg).  Bagel, egg or plain, 4 in., 1 each (70 mg).  Beans, green,  cup (90 mg).  Beans, yellow,  cup (190 mg).  Beer, regular, 12 oz (100 mg).  Beets, canned,  cup (125 mg).  Blackberries,  cup (115 mg).  Blueberries,  cup (60 mg).  Bread, whole wheat, 1 slice (70 mg).  Broccoli, raw,  cup (145 mg).  Cabbage,  cup (150 mg).  Carrots, cooked or raw,  cup (180 mg).  Cauliflower, raw,  cup (150 mg).  Celery, raw,  cup (155 mg).  Cereal, bran flakes, cup (120-150 mg).  Cheese, cottage,  cup (110 mg).  Cherries, 10 each (150 mg).  Chocolate, 1 oz bar (165 mg).  Coffee, brewed 6 oz (90 mg).  Corn,  cup or 1 ear (195 mg).  Cucumbers,  cup (80 mg).  Egg, large, 1 each (60 mg).  Eggplant,  cup (60 mg).  Endive, raw, cup (80 mg).  English muffin, 1 each (65 mg).  Fish, orange roughy, 3 oz (150 mg).  Frankfurter, beef or pork, 1 each (75 mg).  Fruit cocktail,  cup (115 mg).  Grape juice,  cup (170 mg).  Grapefruit,  fruit (175 mg).  Grapes,  cup (155 mg).  Greens: kale, turnip, collard,  cup (110-150 mg).  Ice cream or frozen yogurt, chocolate,  cup (175 mg).  Ice cream or  frozen yogurt, vanilla,  cup (120-150 mg).  Lemons, limes, 1 each (80 mg).  Lettuce, all types, 1 cup (100 mg).  Mixed vegetables,  cup (150 mg).  Mushrooms, raw,  cup (110 mg).  Nuts: walnuts, pecans, or macadamia, 1 oz (125 mg).  Oatmeal,  cup (80 mg).  Okra,  cup (110 mg).  Onions, raw,  cup (120 mg).  Peach, 1 each (185 mg).  Peaches, canned,  cup (120 mg).  Pears, canned,  cup (  120 mg).  Peas, green, frozen,  cup (90 mg).  Peppers, green,  cup (130 mg).  Peppers, red,  cup (160 mg).  Pineapple juice,  cup (165 mg).  Pineapple, fresh or canned,  cup (100 mg).  Plums, 1 each (105 mg).  Pudding, vanilla,  cup (150 mg).  Raspberries,  cup (90 mg).  Rhubarb,  cup (115 mg).  Rice, wild,  cup (80 mg).  Shrimp, 3 oz (155 mg).  Spinach, raw, 1 cup (170 mg).  Strawberries,  cup (125 mg).  Summer squash  cup (175-200 mg).  Swiss chard, raw, 1 cup (135 mg).  Tangerines, 1 each (140 mg).  Tea, brewed, 6 oz (65 mg).  Turnips,  cup (140 mg).  Watermelon,  cup (85 mg).  Wine, red, table, 5 oz (180 mg).  Wine, white, table, 5 oz (100 mg).  Low in potassium The following foods and beverages have less than 50 mg of potassium per serving.  Bread, white, 1 slice (30 mg).  Carbonated beverages, 12 oz (less than 5 mg).  Cheese, 1 oz (20-30 mg).  Cranberries,  cup (45 mg).  Cranberry juice cocktail,  cup (20 mg).  Fats and oils, 1 Tbsp (less than 5 mg).  Hummus, 1 Tbsp (32 mg).  Nectar: papaya, mango, or pear,  cup (35 mg).  Rice, white or brown,  cup (50 mg).  Spaghetti or macaroni,  cup cooked (30 mg).  Tortilla, flour or corn, 1 each (50 mg).  Waffle, 4 in., 1 each (50 mg).  Water chestnuts,  cup (40 mg).  This information is not intended to replace advice given to you by your health care provider. Make sure you discuss any questions you have with your health care provider. Document Released: 11/03/2004  Document Revised: 08/28/2015 Document Reviewed: 02/16/2013 Elsevier Interactive Patient Education  2018 Elsevier Inc.  

## 2016-10-28 NOTE — Telephone Encounter (Signed)
Patient received a letter in the mail stating that the potassium chloride packet is no longer covered by Medicare.  We will need to complete a PA for this med as it is medically necessary. Patient has a neurodegenerative genetic disease and so cannot swallow the large potassium pill so needs the granules in the packet form.

## 2016-10-28 NOTE — Progress Notes (Signed)
Subjective:    Patient ID: Melissa Stewart, female    DOB: 11/20/1970, 46 y.o.   MRN: [id number] Chief Complaint  Patient presents with  . Follow-up    medications     HPI  Past Medical History:  Diagnosis Date  . Neuromuscular disorder (HCC)    History reviewed. No pertinent surgical history. Current Outpatient Prescriptions on File Prior to Visit  Medication Sig Dispense Refill  . azelastine (OPTIVAR) 0.05 % ophthalmic solution Place 1 drop into the left eye 2 (two) times daily. For eye redness and drainage 6 mL 2  . Diclofenac Sodium 3 % CREA Apply 1 application topically 3 (three) times daily. 2500 g 0  . donepezil (ARICEPT) 10 MG tablet Take 10 mg by mouth at bedtime.    . polyethylene glycol powder (GLYCOLAX/MIRALAX) powder DISSOLVE 17 GRAMS  IN WATER & DRINK DAILY 500 g 11   No current facility-administered medications on file prior to visit.    No Known Allergies Family History  Problem Relation Age of Onset  . Diabetes Mother   . Mental illness Brother   . Mental illness Brother    Social History   Social History  . Marital status: Married    Spouse name: N/A  . Number of children: N/A  . Years of education: N/A   Social History Main Topics  . Smoking status: Never Smoker  . Smokeless tobacco: Never Used  . Alcohol use No  . Drug use: No  . Sexual activity: No   Other Topics Concern  . None   Social History Narrative  . None   [facility] 10/28/2016 05/15/2016  Decreased Interest 0 0  Down, Depressed, Hopeless 0 0  PHQ - 2 Score 0 0     Review of Systems See hpi    Objective:   Physical Exam  Constitutional: She appears well-nourished. No distress.  obese  HENT:  Head: Normocephalic and atraumatic.  Right Ear: External ear normal.  Left Ear: External ear normal.  Eyes: Conjunctivae are normal. No scleral icterus.  Neck: Normal range of motion. Neck supple. No thyromegaly present.  Cardiovascular: Normal rate, regular  rhythm, normal heart sounds and intact distal pulses.   Pulmonary/Chest: Effort normal and breath sounds normal. No respiratory distress.  Musculoskeletal: She exhibits no edema.  Lymphadenopathy:    She has no cervical adenopathy.  Neurological: She is alert. She displays atrophy. She exhibits abnormal muscle tone. Coordination abnormal.  Answers questions appropriately (though not always correctly - answers everything in the affirmative). Able to obey simple commands. Able to lean forward in wheel chair about 6 inches  Skin: Skin is warm and dry. She is not diaphoretic. No erythema.  Psychiatric: She has a normal mood and affect. Her behavior is normal.   BP 110/64   Pulse 84   Temp 98 F (36.7 C) (Oral)   Resp 18   SpO2 92%     EKG: NSR, no ischemic change Assessment & Plan:   1. Neuromuscular disease (HCC) - has f/u with neuro next wk  2. Urinary incontinence without sensory awareness - could not gather urine sample even with hat due to incontinence.  3. Cognitive impairment   4. Wheelchair bound   5. Generalized muscle weakness   6. Chronic constipation   7. Medication monitoring encounter   8. Peripheral edema   9. Leukocytosis, unspecified type   10. Elevated glucose   11. Shortness of breath on exertion   12.  Pill dysphagia - Needs letter of medical necessity/PA for powdered K-Clon as pt received letter stating insurance would not cover it. 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  Family requests to be called when we have info from insurance about coverage.  Orders Placed This Encounter  Procedures  . Comprehensive metabolic panel  . Hemoglobin A1c  . Brain natriuretic peptide  . TSH  . CBC with Differential/Platelet  . EKG 12-Lead    Meds ordered this encounter  Medications  . potassium chloride (KLOR-CON) 20 MEQ packet    Sig:  Take 20 mEq by mouth daily.    Dispense:  90 packet    Refill:  1  . QUEtiapine (SEROQUEL) 200 MG tablet    Sig: Take 1 tablet (200 mg total) by mouth at bedtime.    Dispense:  90 tablet    Refill:  1    Please consider 90 day supplies to promote better adherence  . furosemide (LASIX) 40 MG tablet    Sig: Take 1 tablet (40 mg total) by mouth 2 (two) times daily.    Dispense:  180 tablet    Refill:  1    Please consider 90 day supplies to promote better adherence  . ketoconazole (NIZORAL) 2 % shampoo    Sig: Apply 1 application topically daily. As directed by physician    Dispense:  120 mL    Refill:  2    Norberto SorensonEva Felice Hope, M.D.  [facility]  [employer] [address] [place], [place] [id number] [phone] phone [phone] fax  10/30/16 11:36 PM

## 2016-10-29 LAB — COMPREHENSIVE METABOLIC PANEL
ALBUMIN: 4.2 g/dL (ref 3.5–5.5)
ALK PHOS: 116 IU/L (ref 39–117)
ALT: 43 IU/L — ABNORMAL HIGH (ref 0–32)
AST: 30 IU/L (ref 0–40)
Albumin/Globulin Ratio: 1.4 (ref 1.2–2.2)
BILIRUBIN TOTAL: 0.2 mg/dL (ref 0.0–1.2)
BUN / CREAT RATIO: 13 (ref 9–23)
BUN: 11 mg/dL (ref 6–24)
CHLORIDE: 102 mmol/L (ref 96–106)
CO2: 23 mmol/L (ref 20–29)
Calcium: 9.4 mg/dL (ref 8.7–10.2)
Creatinine, Ser: 0.84 mg/dL (ref 0.57–1.00)
GFR calc Af Amer: 97 mL/min/{1.73_m2} (ref >59)
GFR calc non Af Amer: 84 mL/min/{1.73_m2} (ref >59)
GLOBULIN, TOTAL: 2.9 g/dL (ref 1.5–4.5)
GLUCOSE: 123 mg/dL — AB (ref 65–99)
Potassium: 3.6 mmol/L (ref 3.5–5.2)
SODIUM: 140 mmol/L (ref 134–144)
Total Protein: 7.1 g/dL (ref 6.0–8.5)

## 2016-10-29 LAB — CBC WITH DIFFERENTIAL/PLATELET
BASOS ABS: 0.1 10*3/uL (ref 0.0–0.2)
Basos: 1 %
EOS (ABSOLUTE): 0.3 10*3/uL (ref 0.0–0.4)
EOS: 4 %
Hematocrit: 37.2 % (ref 34.0–46.6)
Hemoglobin: 12.3 g/dL (ref 11.1–15.9)
IMMATURE GRANS (ABS): 0.1 10*3/uL (ref 0.0–0.1)
IMMATURE GRANULOCYTES: 1 %
LYMPHS ABS: 3 10*3/uL (ref 0.7–3.1)
Lymphs: 37 %
MCH: 27.6 pg (ref 26.6–33.0)
MCHC: 33.1 g/dL (ref 31.5–35.7)
MCV: 83 fL (ref 79–97)
MONOCYTES: 6 %
Monocytes Absolute: 0.5 10*3/uL (ref 0.1–0.9)
NEUTROS ABS: 4.3 10*3/uL (ref 1.4–7.0)
Neutrophils: 51 %
PLATELETS: 334 10*3/uL (ref 150–379)
RBC: 4.46 x10E6/uL (ref 3.77–5.28)
RDW: 16.3 % — AB (ref 12.3–15.4)
WBC: 8.2 10*3/uL (ref 3.4–10.8)

## 2016-10-29 LAB — HEMOGLOBIN A1C
ESTIMATED AVERAGE GLUCOSE: 126 mg/dL
HEMOGLOBIN A1C: 6 % — AB (ref 4.8–5.6)

## 2016-10-29 LAB — BRAIN NATRIURETIC PEPTIDE: BNP: 15.2 pg/mL (ref 0.0–100.0)

## 2016-10-29 LAB — TSH: TSH: 3.13 u[IU]/mL (ref 0.450–4.500)

## 2016-10-29 NOTE — Telephone Encounter (Signed)
Did you receive a PA on him for Potassium?

## 2016-11-01 NOTE — Telephone Encounter (Signed)
I have not received a PA.

## 2016-11-15 ENCOUNTER — Telehealth: Payer: Self-pay

## 2016-11-15 NOTE — Telephone Encounter (Signed)
Called [employer] to get PA for potassium chloride 20 MEQ packets. They said they will send approval/denial via fax within 24-72 hrs.

## 2016-11-16 NOTE — Telephone Encounter (Signed)
Received a call from insurance company with questions regarding patient and why they can not swallow the tablets.  I told them patient has a neurodegenerative generic disease and cannot swallow pills, per Dr. Alver FisherShaw's notes.  They said they should have a decision within 24 hours.

## 2016-11-17 NOTE — Telephone Encounter (Signed)
Received fax approving Klor-Con meq packets until 04/04/2017. LMVM advising patient.

## 2016-11-24 DIAGNOSIS — Z719 Counseling, unspecified: Secondary | ICD-10-CM | POA: Diagnosis not present

## 2016-11-24 DIAGNOSIS — R413 Other amnesia: Secondary | ICD-10-CM | POA: Diagnosis not present

## 2016-11-24 DIAGNOSIS — Z789 Other specified health status: Secondary | ICD-10-CM | POA: Diagnosis not present

## 2016-12-01 ENCOUNTER — Telehealth: Payer: Self-pay | Admitting: Family Medicine

## 2016-12-01 NOTE — Telephone Encounter (Signed)
Pt sister Joyice FasterGabby wanted to call and see if it was ok to give the pt a sleeping aid.  Please adv

## 2016-12-02 NOTE — Telephone Encounter (Signed)
Call placed to patient, as the person who left proir message is not on any ROI paperwork. Unable to speak with an adult at patient number, phone was given to a child./ S.Wilhelmena Zea,CMA

## 2017-03-16 DIAGNOSIS — Z719 Counseling, unspecified: Secondary | ICD-10-CM | POA: Diagnosis not present

## 2017-03-16 DIAGNOSIS — Z789 Other specified health status: Secondary | ICD-10-CM | POA: Diagnosis not present

## 2017-03-16 DIAGNOSIS — R413 Other amnesia: Secondary | ICD-10-CM | POA: Diagnosis not present

## 2017-03-16 DIAGNOSIS — G319 Degenerative disease of nervous system, unspecified: Secondary | ICD-10-CM | POA: Diagnosis not present

## 2017-03-31 ENCOUNTER — Other Ambulatory Visit: Payer: Self-pay | Admitting: Family Medicine

## 2017-05-02 ENCOUNTER — Ambulatory Visit (INDEPENDENT_AMBULATORY_CARE_PROVIDER_SITE_OTHER): Payer: Medicare Other | Admitting: Family Medicine

## 2017-05-02 ENCOUNTER — Encounter: Payer: Self-pay | Admitting: Family Medicine

## 2017-05-02 ENCOUNTER — Ambulatory Visit: Payer: Medicare Other | Admitting: Family Medicine

## 2017-05-02 VITALS — BP 124/70 | HR 89 | Temp 98.3°F | Resp 16

## 2017-05-02 DIAGNOSIS — R4189 Other symptoms and signs involving cognitive functions and awareness: Secondary | ICD-10-CM | POA: Diagnosis not present

## 2017-05-02 DIAGNOSIS — G4701 Insomnia due to medical condition: Secondary | ICD-10-CM | POA: Diagnosis not present

## 2017-05-02 DIAGNOSIS — Z5181 Encounter for therapeutic drug level monitoring: Secondary | ICD-10-CM | POA: Diagnosis not present

## 2017-05-02 DIAGNOSIS — M6281 Muscle weakness (generalized): Secondary | ICD-10-CM | POA: Diagnosis not present

## 2017-05-02 DIAGNOSIS — Z993 Dependence on wheelchair: Secondary | ICD-10-CM

## 2017-05-02 DIAGNOSIS — N3942 Incontinence without sensory awareness: Secondary | ICD-10-CM

## 2017-05-02 DIAGNOSIS — I87303 Chronic venous hypertension (idiopathic) without complications of bilateral lower extremity: Secondary | ICD-10-CM

## 2017-05-02 DIAGNOSIS — R740 Nonspecific elevation of levels of transaminase and lactic acid dehydrogenase [LDH]: Secondary | ICD-10-CM | POA: Diagnosis not present

## 2017-05-02 DIAGNOSIS — R1310 Dysphagia, unspecified: Secondary | ICD-10-CM | POA: Diagnosis not present

## 2017-05-02 DIAGNOSIS — R7401 Elevation of levels of liver transaminase levels: Secondary | ICD-10-CM | POA: Insufficient documentation

## 2017-05-02 DIAGNOSIS — G709 Myoneural disorder, unspecified: Secondary | ICD-10-CM | POA: Diagnosis not present

## 2017-05-02 DIAGNOSIS — R7303 Prediabetes: Secondary | ICD-10-CM | POA: Insufficient documentation

## 2017-05-02 MED ORDER — TEMAZEPAM 15 MG PO CAPS: 15.0000 mg | ORAL_CAPSULE | Freq: Every evening | ORAL | 5 refills | Status: DC | PRN

## 2017-05-02 MED ORDER — QUETIAPINE FUMARATE 200 MG PO TABS: 200.0000 mg | ORAL_TABLET | Freq: Every day | ORAL | 1 refills | Status: AC

## 2017-05-02 MED ORDER — FUROSEMIDE 40 MG PO TABS: 40.0000 mg | ORAL_TABLET | Freq: Two times a day (BID) | ORAL | 1 refills | Status: DC

## 2017-05-02 MED ORDER — MELATONIN 5 MG PO TABS: 5.0000 mg | ORAL_TABLET | Freq: Every day | ORAL | 0 refills | Status: AC

## 2017-05-02 MED ORDER — POTASSIUM CHLORIDE 20 MEQ PO PACK: 20.0000 meq | PACK | Freq: Two times a day (BID) | ORAL | 1 refills | Status: DC

## 2017-05-02 MED ORDER — KETOCONAZOLE 2 % EX SHAM: 1.0000 "application " | MEDICATED_SHAMPOO | Freq: Every day | CUTANEOUS | 2 refills | Status: AC

## 2017-05-02 NOTE — Progress Notes (Signed)
Subjective:    Patient ID: Melissa Stewart, female    DOB: December 09, 1970, 47 y.o.   MRN: [id number] Chief Complaint  Patient presents with  . Neuromuscular Disease    6 month follow up    HPI  Melissa Stewart presents today for her 6 mo f/u on her bowels, edema, and degenerative neurologic disease. She has cognitive as well as severe physical impairments due to her illness and so is accompanied by 2 of her family members - her sister-in-law who speaks Spanish only and is Melissa Stewart's full-time caregiver, and Melissa Stewart, her sister, who translates.  They have also both accompanied Melissa Stewart to all of her prior visits except for one.    Melissa Stewart is doing great. No edema. No pressure sores/ulcers.  Bowels and urine are at normal incontinence rate - appear normal amount, color, odor - no problems with constatipation. No c/o abd pan. Good appetite.   Does not eat any sweets. Only drinks water. Not a ton of carbs but does enjoy rice, beans, tortillas.  No known h/o blood sugar problems.  Some nights she just never seems sleepy after she is given the seroquel - just goes on chattering away - will talk throughout the night and want things. Doesn't nap during the day or act tired the next day. Family doesn't know where she gets all her energy from and is jealous! But in the interim, when she isn't tired and is bothering people talking all night, her family is then very tired the next day. Has never tried any other sleep meds than the seroquel which she is on currently and takes sched, no side effects noted.  Sometimes when they transfer her, she is able to bear weight on her legs and help them a little but othertimes her legs are very shakey and buckle under her.  Still seeing neurology periodically.  Past Medical History:  Diagnosis Date  . Neuromuscular disorder (HCC)    No past surgical history on file. Current Outpatient Medications on File Prior to Visit  Medication Sig Dispense Refill  . azelastine (OPTIVAR) 0.05 %  ophthalmic solution Place 1 drop into the left eye 2 (two) times daily. For eye redness and drainage 6 mL 2  . Diclofenac Sodium 3 % CREA Apply 1 application topically 3 (three) times daily. 2500 g 0  . donepezil (ARICEPT) 10 MG tablet Take 10 mg by mouth at bedtime.    . furosemide (LASIX) 40 MG tablet Take 1 tablet (40 mg total) by mouth 2 (two) times daily. 180 tablet 1  . ketoconazole (NIZORAL) 2 % shampoo Apply 1 application topically daily. As directed by physician 120 mL 2  . polyethylene glycol powder (GLYCOLAX/MIRALAX) powder DISSOLVE 17 GRAMS  IN WATER & DRINK DAILY 500 g 11  . potassium chloride (KLOR-CON) 20 MEQ packet Take 20 mEq by mouth daily. 90 packet 1  . QUEtiapine (SEROQUEL) 200 MG tablet TAKE 1 TABLET BY MOUTH AT BEDTIME 30 tablet 0   No current facility-administered medications on file prior to visit.    No Known Allergies Family History  Problem Relation Age of Onset  . Diabetes Mother   . Mental illness Brother   . Mental illness Brother    Social History   Socioeconomic History  . Marital status: Married    Spouse name: None  . Number of children: None  . Years of education: None  . Highest education level: None  Social Needs  . Financial resource strain: None  . Food insecurity -  worry: None  . Food insecurity - inability: None  . Transportation needs - medical: None  . Transportation needs - non-medical: None  Occupational History  . None  Tobacco Use  . Smoking status: Never Smoker  . Smokeless tobacco: Never Used  Substance and Sexual Activity  . Alcohol use: No  . Drug use: No  . Sexual activity: No  Other Topics Concern  . None  Social History Narrative  . None     Depression screen [facility] 2/9 05/02/2017 10/28/2016 05/15/2016  Decreased Interest 0 0 0  Down, Depressed, Hopeless 0 0 0  PHQ - 2 Score 0 0 0     Review of Systems  Constitutional: Negative for activity change, appetite change, diaphoresis, fatigue, fever and unexpected weight  change.  Cardiovascular: Negative for leg swelling.  Gastrointestinal: Negative for abdominal pain, constipation, diarrhea, nausea and vomiting.  Genitourinary: Positive for enuresis. Negative for decreased urine volume, difficulty urinating, dysuria, frequency, genital sores and hematuria.  Musculoskeletal: Positive for gait problem.  Skin: Negative for rash and wound.  Allergic/Immunologic: Negative for immunocompromised state.  Neurological: Positive for tremors, speech difficulty and weakness. Negative for seizures and syncope.  Psychiatric/Behavioral: Positive for behavioral problems and sleep disturbance. Negative for agitation, confusion and dysphoric mood. The patient is not nervous/anxious.        Objective:   Physical Exam  Constitutional: Vital signs are normal. She appears well-nourished. She is cooperative. No distress.  Short mildly obese middle aged woman with thinning hair hunched in wheelchair.  HENT:  Head: Not macrocephalic and not microcephalic.  Right Ear: External ear normal.  Left Ear: External ear normal.  Cardiovascular: Normal rate, regular rhythm, S1 normal, S2 normal and normal heart sounds.  No murmur heard. 1+ pedal edema B  Pulmonary/Chest: Effort normal and breath sounds normal.  Neurological: She is alert. She displays atrophy. She exhibits abnormal muscle tone.  Speaks (Spanish-only) in single syllable, single word bursts and dysarthric but family understands pt (and her intent from facial expressions) and interprets.  Wheelchair bound, Completely non-ambulatory. Max assist with transfers. Can lean forward in wheelchair on own. Follows some basic commands.  Psychiatric: She has a normal mood and affect. Her speech is delayed. She is slowed. Cognition and memory are impaired.  Pleasant demeanor. Answers all "yes/no" questions with yes. Always denies any problems. Does express displeasure with a grunt and lowered eyebrows when I tell her I want to draw her  blood today (hates needles). Remembers that we drew blood last visit.      BP 124/70   Pulse 89   Temp 98.3 F (36.8 C) (Oral)   Resp 16   SpO2 95%      Assessment & Plan:   1. Stasis edema of both lower extremities - continue current lasix 40/K 20 bid - working well.   2. Medication monitoring encounter   3. Prediabetes - new diagnosis 6 mos ago. Will try to decrease rice and tortillas  4. Elevated ALT measurement - suspect fatty liver, recheck  5. Neuromuscular disease (HCC) - follows with [facility] sev times/yr. Did home PT 2 yrs ago and again last yr but was difficult to get Melissa Stewart to participate - she didn't like it.  6. Generalized muscle weakness   7. Wheelchair bound   8. Cognitive impairment   9. Urinary incontinence without sensory awareness   10. Insomnia due to medical condition - previously responded well to seroquel and increased dose whenever sxs flair. Now on seroquel  200mg  and sxs recurring.  Ok to try melatonin supplement that family asked about. Then try prn temazepam on nights that she isn't acting sleepy.  12.    Pill dysphagia - - Might again needs letter of medical necessity/PA for powdered K-Clon. 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 Comprehensive metabolic panel  . Hemoglobin A1c  . Magnesium  . Phosphorus    Meds ordered this encounter  Medications  . furosemide (LASIX) 40 MG tablet    Sig: Take 1 tablet (40 mg total) by mouth 2 (two) times daily.    Dispense:  180 tablet    Refill:  1    Please consider 90 day supplies to promote better adherence  . ketoconazole (NIZORAL) 2 % shampoo    Sig: Apply 1 application topically daily. As directed by physician    Dispense:  120 mL    Refill:  2  . potassium chloride (KLOR-CON) 20 MEQ packet    Sig: Take 20 mEq by mouth 2 (two) times daily.    Dispense:  180 packet    Refill:  1    . QUEtiapine (SEROQUEL) 200 MG tablet    Sig: Take 1 tablet (200 mg total) by mouth at bedtime.    Dispense:  90 tablet    Refill:  1  . temazepam (RESTORIL) 15 MG capsule    Sig: Take 1 capsule (15 mg total) by mouth at bedtime as needed for sleep.    Dispense:  30 capsule    Refill:  5  . Melatonin 5 MG TABS    Sig: Take 1 tablet (5 mg total) by mouth at bedtime.    Refill:  0    Norberto Sorenson, M.D.  Primary Care at [facility] [address] [place], [place] [address] [phone] phone [phone] fax  05/05/17 1:06 AM

## 2017-05-02 NOTE — Patient Instructions (Addendum)
Since you have been having some muscle weakness at times, lets try doubling your potassium supplement daily - take a potassium dose every time you take a furosemide dose.  Start a melatonin supplement - ~5mg  every night about 1-2 hours before bed with the seroquel. Try taking a temazepam (Restoril) sedative pill at night if you are not asleep within an hour or so of taking the seroquel and melatonin..   IF you received an x-ray today, you will receive an invoice from [facility]. Please contact [facility] at [phone] with questions or concerns regarding your invoice.   IF you received labwork today, you will receive an invoice from [facility]. Please contact [facility] at [phone] with questions or concerns regarding your invoice.   Our billing staff will not be able to assist you with questions regarding bills from these companies.  You will be contacted with the lab results as soon as they are available. The fastest way to get your results is to activate your My Chart account. Instructions are located on the last page of this paperwork. If you have not heard from [place] regarding the results in 2 weeks, please contact this office.     Plan de alimentacin para la prediabetes (Prediabetes Eating Plan) La prediabetes, tambin llamada intolerancia a la glucosa o alteracin de la glucosa en ayunas, es una afeccin que eleva los niveles de azcar en la sangre (glucemia) por encima de lo normal. Seguir una dieta saludable puede ayudar a mantener la prediabetes bajo control, y tambin reduce el riesgo de tener diabetes tipo2 y cardiopata, que es ms alto en las personas que tienen esta afeccin. Junto con la actividad fsica habitual, una dieta saludable:  Promueve la prdida de [place].  Ayuda a Medical sales representative de Banker.  Ayuda a mejorar la forma en que el organismo [place] la insulina. QU DEBO SABER ACERCA DE ESTE PLAN DE ALIMENTACIN?  Use el ndice  glucmico (IG) para planificar las comidas. El ndice le informa con qu rapidez un alimento elevar su nivel de azcar en la sangre. Elija los alimentos con bajo IG. Estos tardan ms tiempo en subir el nivel de azcar en la sangre.  Preste mucha atencin a la cantidad de hidratos de carbono que hay en los alimentos que consume. Los hidratos de carbono [employer] niveles de Banker.  Lleve un registro de la cantidad de caloras que ingiere. Ingerir la cantidad correcta de caloras lo ayudar a Cabin crew peso saludable. Bajar alrededor del 7por ciento del peso inicial puede ayudar a Automotive engineer la diabetes tipo2.  Tal vez deba seguir Clinical cytogeneticist. Esta incluye una gran cantidad de verduras, carnes magras o pescado, cereales integrales, frutas, as como aceites y grasas saludables.  QU ALIMENTOS PUEDO COMER? Cereales Cereales integrales, como panes, galletas, cereales y pastas de salvado o integrales. Avena sin azcar. Trigo burgol. Cebada. Quinua. Arroz integral. Tortillas o tacos de harina de maz o de salvado. Hoover Brunette Deatra James. Espinaca. Guisantes. Remolachas. Coliflor. Repollo. Brcoli. Zanahorias. Tomates. Calabaza. Christella Noa. Hierbas. Pimientos. Cebollas. Pepinos. Repollitos de Bruselas. Frutas Frutos rojos. Bananas. Manzanas. Naranjas. Uvas. Papaya. Mango. Granada. Kiwi. Pomelo. Cerezas. Carnes y otras fuentes de protenas Mariscos. Carnes [place], entre ellas, pollo y Willows o cortes magros de carne de cerdo y de Sharon. Tofu. Huevos. Los frutos secos. Frijoles. Lcteos Productos lcteos descremados o semidescremados, como yogur, queso cottage y Gillespie. [employer] Inc. T. Caf. Gaseosas sin azcar o dietticas. Agua de [place]. Leche. Productos alternativos de  la Turtle Riverleche, [address]como leche de soja o de Crystal Lakealmendra. Condimentos Mostaza. Salsa de pepinillos. [place] con bajo contenido de Antar[place] (the territory South of 60 deg S)grasa y de International aid/development workerazcar. Salsa barbacoa con bajo contenido de grasa y de azcar. Mayonesa sin grasa o con  bajo contenido de Duncombegrasa. Dulces y postres Budines sin azcar o con bajo contenido de Hartingtongrasa. Helados y otros dulces congelados sin azcar o con bajo contenido de Dierksgrasa. Grasas y Arts development officeraceites Aguacate. Nueces. Aceite de oliva. Los artculos mencionados arriba pueden no ser Raytheonuna lista completa de las bebidas o los alimentos recomendados. Comunquese con el nutricionista para conocer ms opciones. QU ALIMENTOS NO SE RECOMIENDAN? Cereales Productos a base de Keny[place] y de Madagascarharina blanca refinada, como panes, pastas, bocadillos y cereales. Bebidas Bebidas azucaradas, como t helado y gaseosas con International aid/development workerazcar. Dulces y [address] Productos de Sanduskypanadera, St. Maryscomo tortas, Oaklandmadalenas, Bonnievillepasteles, Programmer, systemsgalletitas y tarta de Middle Riverqueso. Los artculos mencionados arriba pueden no ser Raytheonuna lista completa de las bebidas y los alimentos que se Theatre stage managerdeben evitar. Comunquese con el nutricionista para obtener ms informacin. Esta informacin no tiene Theme park managercomo fin reemplazar el consejo del mdico. Asegrese de hacerle al mdico cualquier pregunta que tenga. Document Released: 12/11/2014 Document Revised: 12/11/2014 Document Reviewed: 04/17/2014 Elsevier Interactive Patient Education  2017 Elsevier Inc.  Prediabetes (Prediabetes) QU ES LA PREDIABETES? La prediabetes es la enfermedad que presenta un nivel de azcar en la sangre (glucemia) ms alto de lo normal, pero no lo suficientemente alto como para que le diagnostiquen diabetes tipo2. El hecho de ser prediabtico lo pone en riesgo de desarrollar diabetes tipo2 (diabetes mellitus tipo2). La prediabetes tambin se puede llamar intolerancia a la glucosa o glucosa alterada en ayunas. Generalmente, la prediabetes no causa sntomas. El mdico puede diagnosticar esta enfermedad por los anlisis de Georgetownsangre. Los anlisis para Engineer, manufacturingdetectar la prediabetes se pueden realizar si usted tiene sobrepeso y si presenta al menos un factor de riesgo ms de prediabetes. Entre los factores de riesgo de prediabetes, se  incluyen los siguientes:  Warehouse managerTener un familiar con diabetes tipo2.  Sobrepeso u obesidad.  Tener ms de 45 aos.  Ser descendiente de indgenas norteamericanos, afroamericanos, hispanos o latinos, o asiticos o isleos del Pacfico.  Tener un estilo de vida inactivo (sedentario).  Tener antecedentes de diabetes gestacional o sndrome de ovario poliqustico (SOP).  Tener niveles bajos del colesterol bueno (HDL-C) o niveles altos de grasas en la sangre (triglicridos).  Tener hipertensin arterial. QU ES LA GLUCEMIA Y CMO SE MIDE? La glucemia hace referencia a la cantidad de glucosa que tiene en el torrente sanguneo. La glucosa proviene de los alimentos que contienen azcar y almidn (carbohidratos) que el organismo descompone para formar glucosa. El nivel de glucemia se puede medir en mg/dl (miligramos por decilitro) o mmol/l (milimoles por litro).La glucemia puede controlarse con uno o ms de los siguientes anlisis de sangre:  Medicin de la glucemia en Collinsvilleayunas. No se le permitir comer (tendr que Devon Energyhacer ayuno) durante al menos 8horas antes de que se tome una Huckabaymuestra de Burtons Bridgesangre. ? Un rango normal de glucemia en ayunas es de 70 a 100mg /dl (de 3,9 a 4,0JWJX/B5,6mmol/l).  Un anlisis de sangre de A1c (hemoglobina A1c). Este anlisis proporciona informacin sobre el control de la glucemia durante los ltimos 2 o 3meses.  Prueba de tolerancia a la glucosa oral (PTGO). Esta prueba mide la glucemia dos veces: ? Despus del ayuno. Este es el valor inicial. ? Dos horas despus de ingerir una bebida que contiene glucosa. Pueden diagnosticarle prediabetes en los siguientes casos:  Si la  glucemia en ayunas es de 100 a 125mg /dl (de 5,6 a 1,6XWRU/E).  Si el nivel de A1c es del 5,7% al 6,4%.  Si el resultado de la PTGO es de 140 a 199mg /dl (de 7,8 a 45WUJW/J). Estos anlisis de sangre se pueden repetir para Pharmacist, hospital diagnstico. QU SUCEDE SI LA GLUCEMIA ES DEMASIADO ALTA? El pncreas  produce una hormona (insulina) que ayuda a Merchant navy officer glucosa desde el torrente sanguneo hacia las clulas. Cuando las clulas no responden de forma [place] a la insulina que el organismo produce (resistencia a la insulina), el exceso de glucosa se acumula en la sangre en vez de dirigirse hacia las clulas. Como consecuencia, se puede desarrollar glucemia alta (hiperglucemia), que puede causar muchas complicaciones. Este es uno de los sntomas de la prediabetes. QU PUEDE SUCEDER SI LA GLUCEMIA PERMANECE MS ALTA DE LO NORMAL DURANTE MUCHO TIEMPO? Es peligroso Public librarian glucemia alta durante mucho tiempo. Demasiada glucosa en la sangre puede daar los nervios y los vasos sanguneos. El dao a largo plazo puede provocar complicaciones de la diabetes, por ejemplo:  Cardiopata.  Ictus.  Ceguera.  Enfermedad renal.  Depresin.  Mala circulacin en los pies y en las piernas, que podra llevar a la extraccin quirrgica (amputacin) en casos graves. CMO SE PUEDE EVITAR QUE LA PREDIABETES SE CONVIERTA EN DIABETES TIPO2? Para prevenir la diabetes tipo2, tome las siguientes medidas:  Haga actividad fsica. ? Haga actividad fsica de intensidad moderada durante al menos como mnimo 5das por Wells [employer], o tanto como le haya indicado el mdico. Podra hacer caminatas dinmicas, ciclismo o [place]. ? Pregntele al mdico qu actividades son seguras para usted. Una combinacin de actividades puede ser la mejor opcin, por ejemplo, caminar, practicar natacin, andar en bicicleta y hacer entrenamiento de fuerza.  Baje de General Electric se lo haya indicado el mdico. ? Bajar entre el 5% y el 7% del peso corporal puede revertir la resistencia a la insulina. ? El mdico puede determinar cuntos kilos tiene que bajar y [place] a que adelgace de Wellsite geologist segura.  Siga un plan de alimentacin saludable. Este incluye consumir protenas magras, hidratos de carbono complejos, frutas y verduras  frescas, productos lcteos con bajo contenido de grasa y grasas saludables. ? Siga las indicaciones del mdico respecto de las restricciones para las comidas o las bebidas. ? Programe una cita con un especialista en alimentacin y nutricin (nutricionista certificado) para que lo ayude a Quarry manager plan de alimentacin saludable adecuado para usted.  No fume ni consuma ningn producto que contenga tabaco, lo que incluye cigarrillos, tabaco de Theatre manager y Administrator, Civil Service. Si necesita ayuda para dejar de fumar, consulte al American Express.  Baxter International de venta libre y los recetados como se lo haya indicado el mdico. Es posible que le receten medicamentos que ayuden a disminuir el riesgo de tener diabetes tipo2. Esta informacin no tiene Theme park manager el consejo del mdico. Asegrese de hacerle al mdico cualquier pregunta que tenga. Document Released: 05/13/2015 Document Revised: 05/13/2015 Document Reviewed: 05/13/2015 Elsevier Interactive Patient Education  [employer].

## 2017-05-03 LAB — COMPREHENSIVE METABOLIC PANEL
A/G RATIO: 1.6 (ref 1.2–2.2)
ALK PHOS: 127 IU/L — AB (ref 39–117)
ALT: 49 IU/L — ABNORMAL HIGH (ref 0–32)
AST: 32 IU/L (ref 0–40)
Albumin: 4.4 g/dL (ref 3.5–5.5)
BUN/Creatinine Ratio: 19 (ref 9–23)
BUN: 16 mg/dL (ref 6–24)
Bilirubin Total: 0.3 mg/dL (ref 0.0–1.2)
CALCIUM: 9.5 mg/dL (ref 8.7–10.2)
CO2: 21 mmol/L (ref 20–29)
Chloride: 102 mmol/L (ref 96–106)
Creatinine, Ser: 0.86 mg/dL (ref 0.57–1.00)
GFR calc Af Amer: 94 mL/min/{1.73_m2} (ref >59)
GFR calc non Af Amer: 81 mL/min/{1.73_m2} (ref >59)
Globulin, Total: 2.8 g/dL (ref 1.5–4.5)
Glucose: 102 mg/dL — ABNORMAL HIGH (ref 65–99)
Potassium: 3.9 mmol/L (ref 3.5–5.2)
SODIUM: 143 mmol/L (ref 134–144)
TOTAL PROTEIN: 7.2 g/dL (ref 6.0–8.5)

## 2017-05-03 LAB — HEMOGLOBIN A1C
ESTIMATED AVERAGE GLUCOSE: 120 mg/dL
HEMOGLOBIN A1C: 5.8 % — AB (ref 4.8–5.6)

## 2017-05-03 LAB — PHOSPHORUS: PHOSPHORUS: 3.6 mg/dL (ref 2.5–4.5)

## 2017-05-03 LAB — MAGNESIUM: Magnesium: 2.5 mg/dL — ABNORMAL HIGH (ref 1.6–2.3)

## 2017-05-05 ENCOUNTER — Encounter: Payer: Self-pay | Admitting: *Deleted

## 2017-05-05 DIAGNOSIS — R1310 Dysphagia, unspecified: Secondary | ICD-10-CM | POA: Insufficient documentation

## 2017-05-05 DIAGNOSIS — G4701 Insomnia due to medical condition: Secondary | ICD-10-CM | POA: Insufficient documentation

## 2017-09-07 DIAGNOSIS — G319 Degenerative disease of nervous system, unspecified: Secondary | ICD-10-CM | POA: Diagnosis not present

## 2017-09-29 DIAGNOSIS — R531 Weakness: Secondary | ICD-10-CM | POA: Diagnosis not present

## 2017-09-29 DIAGNOSIS — R413 Other amnesia: Secondary | ICD-10-CM | POA: Diagnosis not present

## 2017-09-29 DIAGNOSIS — Z719 Counseling, unspecified: Secondary | ICD-10-CM | POA: Diagnosis not present

## 2017-10-31 ENCOUNTER — Ambulatory Visit: Payer: Medicare Other | Admitting: Family Medicine

## 2017-12-16 ENCOUNTER — Telehealth: Payer: Self-pay | Admitting: Family Medicine

## 2017-12-16 NOTE — Telephone Encounter (Signed)
Patient's guardian is requesting medication refills for 1 month due to her appt being cancelled. Please advise.  Best CB# [phone]    Called and spoke with pt.s guardian. I advised her of Dr. Clelia CroftShaw taking leave for September and October. I was able to get the patient rescheduled with Dr. Clelia CroftShaw on 11/13 at 10:40. I advised of time, building number and late policy.

## 2017-12-19 ENCOUNTER — Ambulatory Visit: Payer: Medicare Other | Admitting: Family Medicine

## 2017-12-27 ENCOUNTER — Other Ambulatory Visit: Payer: Self-pay | Admitting: *Deleted

## 2017-12-27 MED ORDER — TEMAZEPAM 15 MG PO CAPS: 15.0000 mg | ORAL_CAPSULE | Freq: Every evening | ORAL | 0 refills | Status: DC | PRN

## 2017-12-27 MED ORDER — FUROSEMIDE 40 MG PO TABS: 40.0000 mg | ORAL_TABLET | Freq: Two times a day (BID) | ORAL | 0 refills | Status: AC

## 2017-12-27 MED ORDER — POTASSIUM CHLORIDE 20 MEQ PO PACK: 20.0000 meq | PACK | Freq: Two times a day (BID) | ORAL | 0 refills | Status: AC

## 2017-12-27 NOTE — Telephone Encounter (Signed)
Dr Clelia CroftShaw can you refill Restoril for this patient

## 2018-01-19 ENCOUNTER — Telehealth: Payer: Self-pay | Admitting: Family Medicine

## 2018-01-19 NOTE — Telephone Encounter (Signed)
Tried to call pt to switch her appt from 10:40 to 10:30 due to a template change in Dr. Alver Fisher schedule. I was not able to get in touch with pt. The home number could not be connected and the mobile number had someone else's name on the message. NO VM was left.

## 2018-01-24 ENCOUNTER — Ambulatory Visit: Payer: Medicare Other | Admitting: Family Medicine

## 2018-01-25 DIAGNOSIS — G319 Degenerative disease of nervous system, unspecified: Secondary | ICD-10-CM | POA: Diagnosis not present

## 2018-01-25 DIAGNOSIS — Z719 Counseling, unspecified: Secondary | ICD-10-CM | POA: Diagnosis not present

## 2018-01-25 DIAGNOSIS — R531 Weakness: Secondary | ICD-10-CM | POA: Diagnosis not present

## 2018-01-25 DIAGNOSIS — R413 Other amnesia: Secondary | ICD-10-CM | POA: Diagnosis not present

## 2018-02-01 IMAGING — DX DG SHOULDER 2+V*L*
3 series · 3 of 3 positions shown · non-contrast
Comparison: None.

CLINICAL DATA: Lt shoulder pain no known injury Sister felt knot
posterior shoulder NCP shielded

EXAM:
LEFT SHOULDER - 2+ VIEW

[shoulder ap]
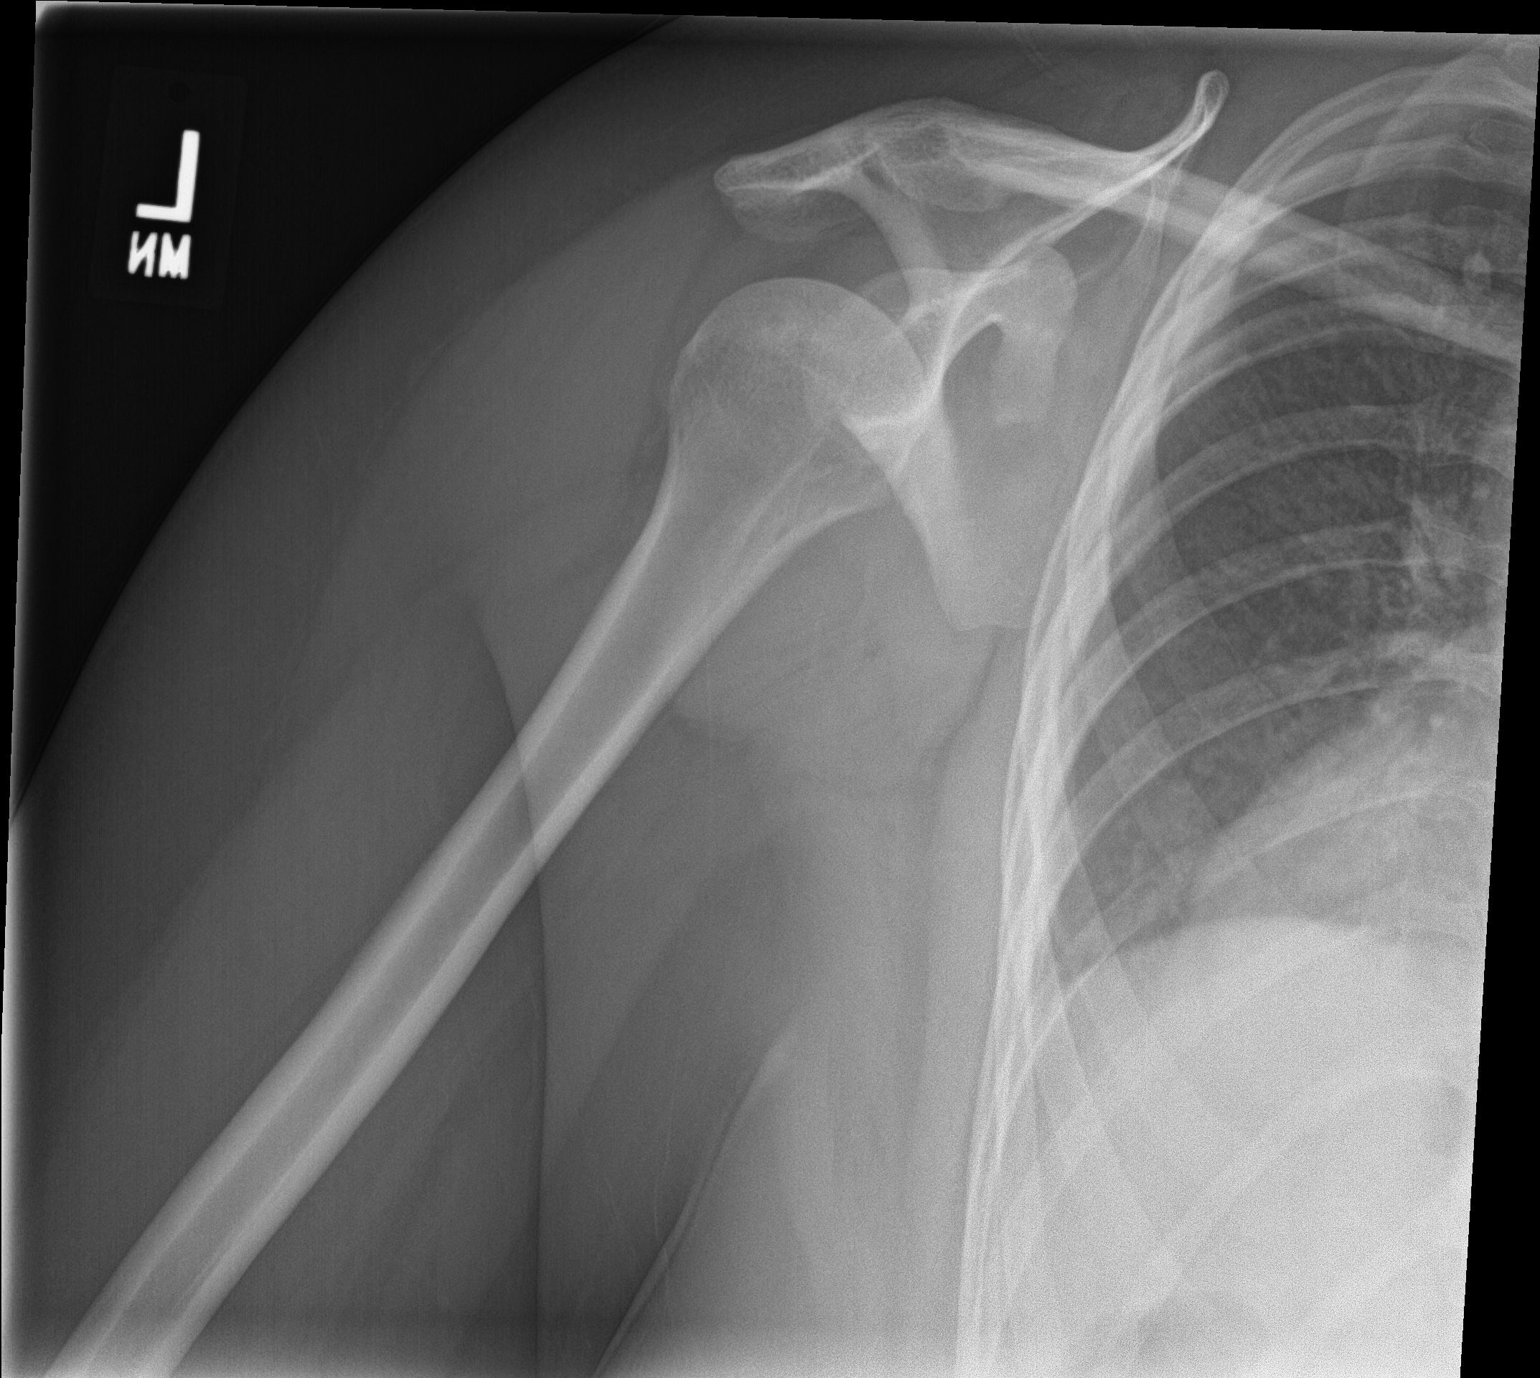

[shoulder y-view]
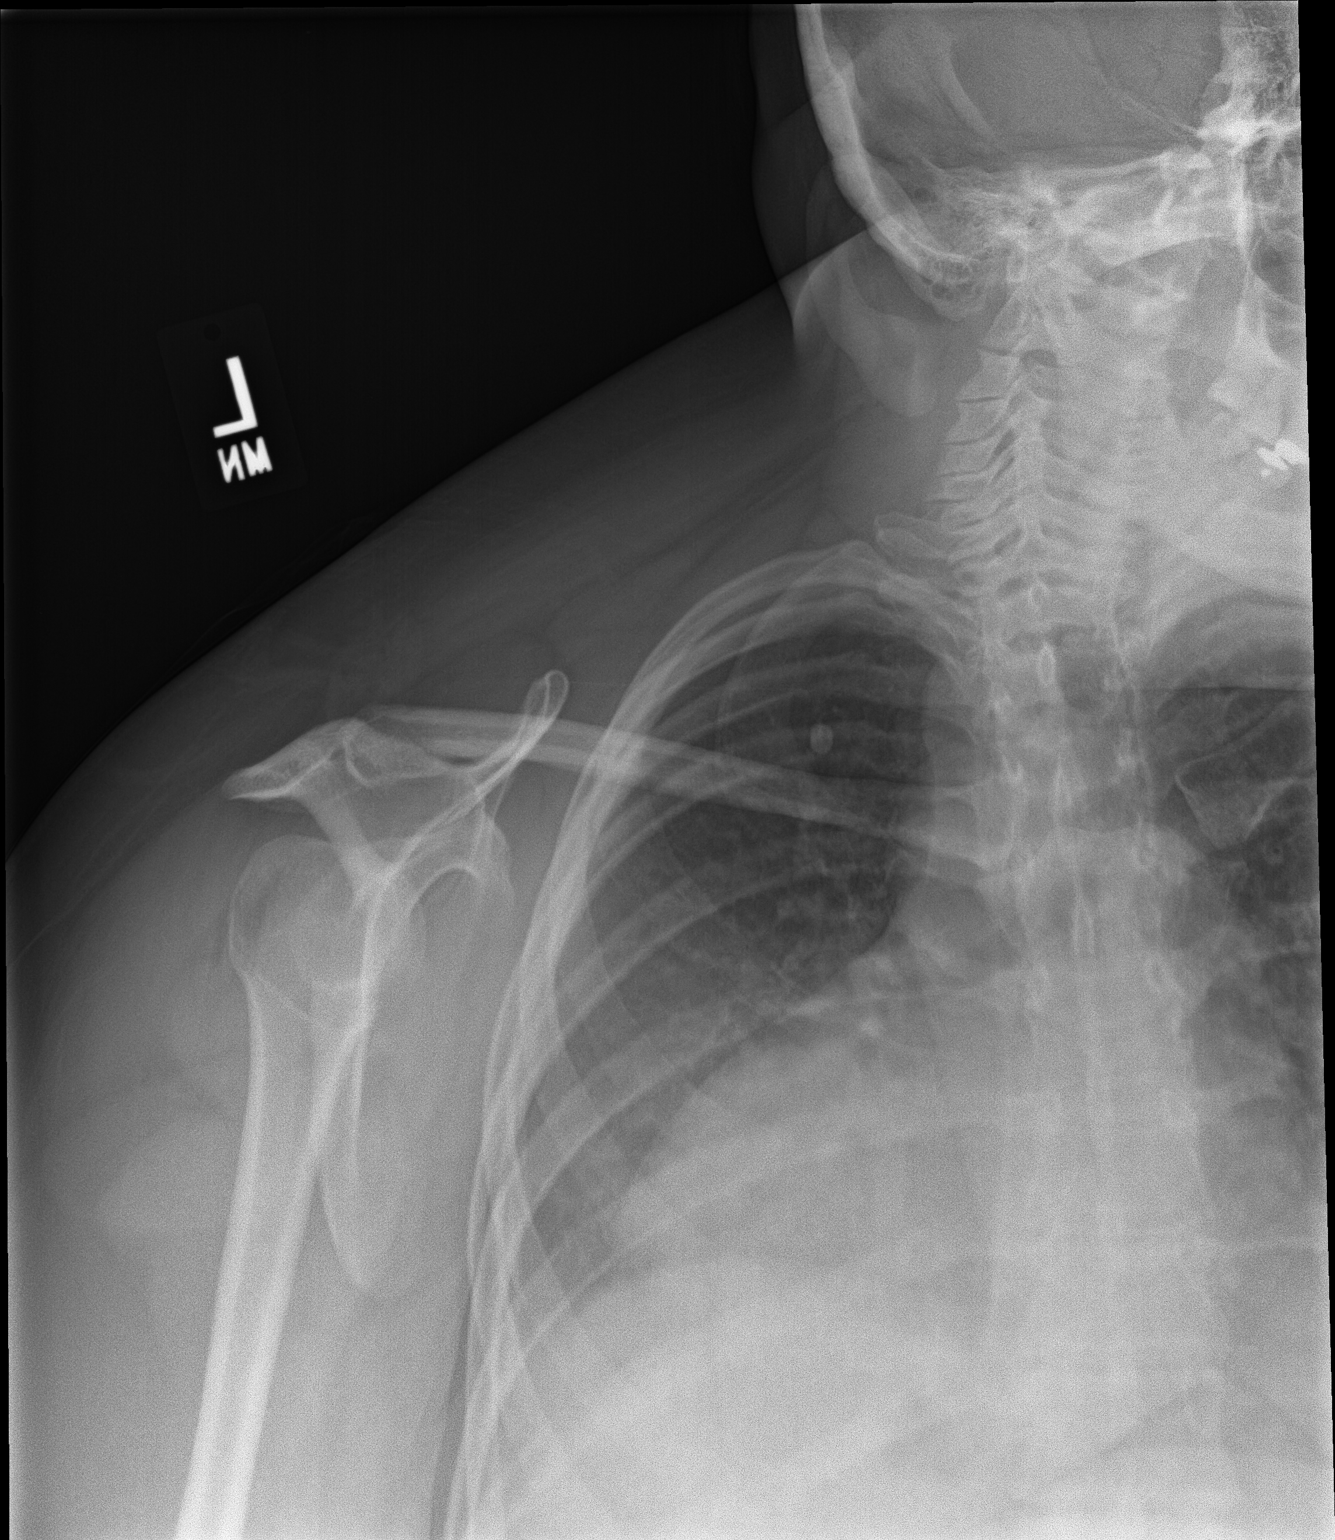

[shoulder axial]
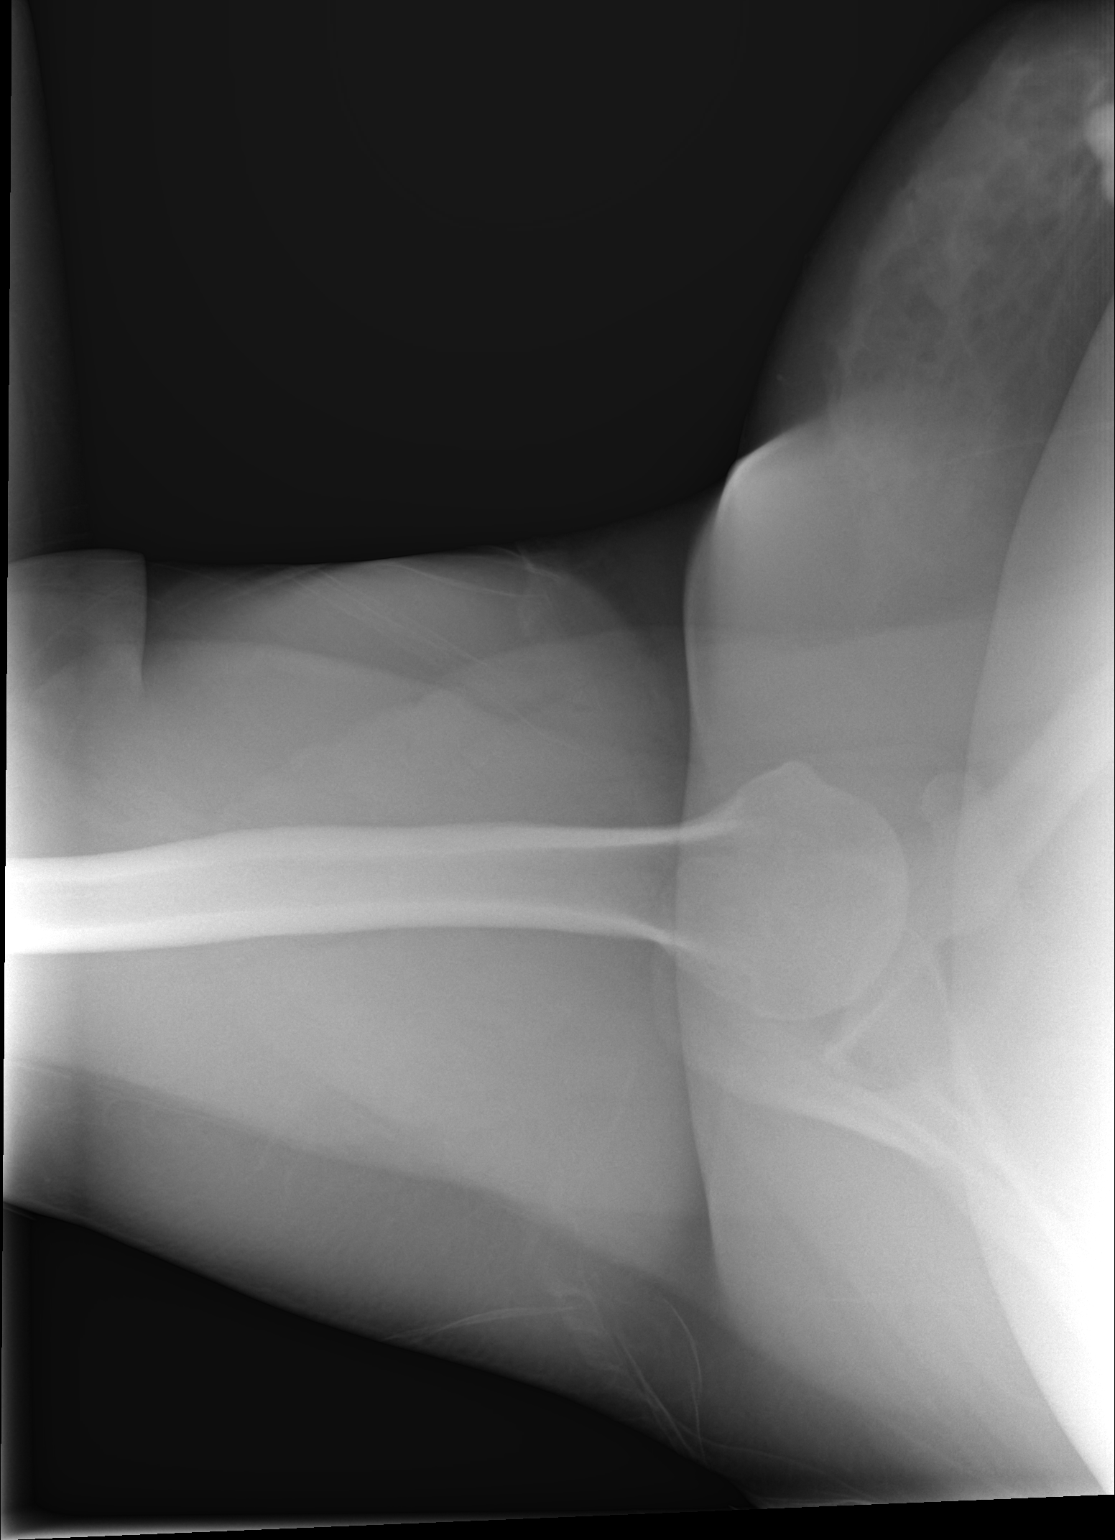

[3 of 3 positions shown; findings below may reference images not displayed]

FINDINGS: Glenohumeral joint is intact. No evidence of scapular fracture or
humeral fracture. The acromioclavicular joint is intact.
IMPRESSION: 1. No fracture or dislocation.
2.
3. No arthropathy identified.

## 2018-02-10 ENCOUNTER — Other Ambulatory Visit: Payer: Self-pay | Admitting: Family Medicine

## 2018-02-10 NOTE — Telephone Encounter (Signed)
Requested medication (s) are due for refill today: yes  Requested medication (s) are on the active medication list: yes  Last refill:  1/2//19 #90 with 1 refill  Future visit scheduled: yes, 02/15/18  Notes to clinic:  LOV on 05/02/17    Requested Prescriptions  Pending Prescriptions Disp Refills   QUEtiapine (SEROQUEL) 200 MG tablet [Pharmacy Med Name: QUEtiapine Fumarate 200MG  TAB] 30 tablet 0    Sig: TAKE 1 TABLET BY MOUTH AT BEDTIME     Not Delegated - Psychiatry:  Antipsychotics - Second Generation (Atypical) - quetiapine Failed - 02/10/2018  5:16 PM      Failed - This refill cannot be delegated      Failed - ALT in normal range and within 180 days    ALT  Date Value Ref Range Status  05/02/2017 49 (H) 0 - 32 IU/L Final         Failed - AST in normal range and within 180 days    AST  Date Value Ref Range Status  05/02/2017 32 0 - 40 IU/L Final         Failed - Valid encounter within last 6 months    Recent Outpatient Visits          9 months ago Stasis edema of both lower extremities   [facility] Levell July, MD   1 year ago Neuromuscular disease [facility])   [facility] Levell July, MD   1 year ago Peripheral edema   [facility] Levell July, MD   1 year ago Edema, unspecified type   [facility] Levell July, MD   1 year ago Chronic left shoulder pain   [facility], Levell July, MD      Future Appointments            In 5 days Sherren Mocha, MD Primary [facility] at [facility]           Passed - Last BP in normal range    BP Readings from Last 1 Encounters:  05/02/17 124/70

## 2018-02-12 NOTE — Telephone Encounter (Signed)
Refill req for Seroquel sent to Dr. Clelia Croft Pt has appt 11/13 with Dr. Clelia Croft

## 2018-02-15 ENCOUNTER — Encounter: Payer: Self-pay | Admitting: Family Medicine

## 2018-02-15 ENCOUNTER — Other Ambulatory Visit: Payer: Self-pay

## 2018-02-15 ENCOUNTER — Encounter (INDEPENDENT_AMBULATORY_CARE_PROVIDER_SITE_OTHER): Payer: Medicare Other | Admitting: Family Medicine

## 2018-02-15 VITALS — BP 136/84 | HR 73 | Temp 98.0°F | Resp 18

## 2018-02-15 DIAGNOSIS — R7303 Prediabetes: Secondary | ICD-10-CM | POA: Diagnosis not present

## 2018-02-15 DIAGNOSIS — R4189 Other symptoms and signs involving cognitive functions and awareness: Secondary | ICD-10-CM | POA: Diagnosis not present

## 2018-02-15 DIAGNOSIS — Z23 Encounter for immunization: Secondary | ICD-10-CM | POA: Diagnosis not present

## 2018-02-15 DIAGNOSIS — R740 Nonspecific elevation of levels of transaminase and lactic acid dehydrogenase [LDH]: Secondary | ICD-10-CM | POA: Diagnosis not present

## 2018-02-15 DIAGNOSIS — G709 Myoneural disorder, unspecified: Secondary | ICD-10-CM | POA: Diagnosis not present

## 2018-02-15 DIAGNOSIS — M6281 Muscle weakness (generalized): Secondary | ICD-10-CM | POA: Diagnosis not present

## 2018-02-15 LAB — POCT CBC
Granulocyte percent: 60.9 %G (ref 37–80)
HCT, POC: 42.9 % — AB (ref 29–41)
Hemoglobin: 14.4 g/dL — AB (ref 9.5–13.5)
Lymph, poc: 3.4 (ref 0.6–3.4)
MCH: 29.4 pg (ref 27–31.2)
MCHC: 33.6 g/dL (ref 31.8–35.4)
MCV: 87.4 fL (ref 76–111)
MID (CBC): 0.6 (ref 0–0.9)
MPV: 8.9 fL (ref 0–99.8)
POC Granulocyte: 6.2 (ref 2–6.9)
POC LYMPH %: 33.2 % (ref 10–50)
POC MID %: 5.9 %M (ref 0–12)
Platelet Count, POC: 274 10*3/uL (ref 142–424)
RBC: 4.91 M/uL (ref 4.04–5.48)
RDW, POC: 15.4 %
WBC: 10.1 10*3/uL (ref 4.6–10.2)

## 2018-02-15 LAB — POCT GLYCOSYLATED HEMOGLOBIN (HGB A1C): HEMOGLOBIN A1C: 5.6 % (ref 4.0–5.6)

## 2018-02-15 NOTE — Progress Notes (Deleted)
Subjective:    Patient: Melissa Stewart  DOB: [birth year]; 47 y.o.   MRN: [id number]  Chief Complaint  Patient presents with  . Chronic Conditions    follow-up     HPI Seeing [facility]  Medical History Past Medical History:  Diagnosis Date  . Neuromuscular disorder (HCC)    History reviewed. No pertinent surgical history. Current Outpatient Medications on File Prior to Visit  Medication Sig Dispense Refill  . azelastine (OPTIVAR) 0.05 % ophthalmic solution Place 1 drop into the left eye 2 (two) times daily. For eye redness and drainage 6 mL 2  . Diclofenac Sodium 3 % CREA Apply 1 application topically 3 (three) times daily. 2500 g 0  . donepezil (ARICEPT) 10 MG tablet Take 10 mg by mouth at bedtime.    . furosemide (LASIX) 40 MG tablet Take 1 tablet (40 mg total) by mouth 2 (two) times daily. 180 tablet 0  . ketoconazole (NIZORAL) 2 % shampoo Apply 1 application topically daily. As directed by physician 120 mL 2  . Melatonin 5 MG TABS Take 1 tablet (5 mg total) by mouth at bedtime.  0  . memantine (NAMENDA) 10 MG tablet Take 10 mg by mouth daily.  5  . mirtazapine (REMERON) 15 MG tablet Take 15 mg by mouth at bedtime.  3  . polyethylene glycol powder (GLYCOLAX/MIRALAX) powder DISSOLVE 17 GRAMS  IN WATER & DRINK DAILY 500 g 11  . potassium chloride (KLOR-CON) 20 MEQ packet Take 20 mEq by mouth 2 (two) times daily. 180 packet 0  . QUEtiapine (SEROQUEL) 200 MG tablet Take 1 tablet (200 mg total) by mouth at bedtime. 90 tablet 1  . temazepam (RESTORIL) 30 MG capsule TAKE 1 CAPSULE BY MOUTH AT BEDTIME AS NEEDED FOR INSOMNIA  1   No current facility-administered medications on file prior to visit.    No Known Allergies Family History  Problem Relation Age of Onset  . Diabetes Mother   . Mental illness Brother   . Mental illness Brother    Social History   Socioeconomic History  . Marital status: Married    Spouse name: Not on file  . Number of children: Not on file   . Years of education: Not on file  . Highest education level: Not on file  Occupational History  . Not on file  Social Needs  . Financial resource strain: Not on file  . Food insecurity:    Worry: Not on file    Inability: Not on file  . Transportation needs:    Medical: Not on file    Non-medical: Not on file  Tobacco Use  . Smoking status: Never Smoker  . Smokeless tobacco: Never Used  Substance and Sexual Activity  . Alcohol use: No  . Drug use: No  . Sexual activity: Never  Lifestyle  . Physical activity:    Days per week: Not on file    Minutes per session: Not on file  . Stress: Not on file  Relationships  . Social connections:    Talks on phone: Not on file    Gets together: Not on file    Attends religious service: Not on file    Active member of club or organization: Not on file    Attends meetings of clubs or organizations: Not on file    Relationship status: Not on file  Other Topics Concern  . Not on file  Social History Narrative  . Not on file   Depression screen  [facility] 2/9 02/15/2018 05/02/2017 10/28/2016 05/15/2016  Decreased Interest 0 0 0 0  Down, Depressed, Hopeless 0 0 0 0  PHQ - 2 Score 0 0 0 0    ROS As noted in HPI  Objective:  BP 136/84   Pulse 73   Temp 98 F (36.7 C) (Other (Comment))   Resp 18   SpO2 95%  Physical Exam  POC TESTING No visits with results within 3 Day(s) from this visit.  Latest known visit with results is:  Office Visit on 05/02/2017  Component Date Value Ref Range Status  . Glucose 05/02/2017 102* 65 - 99 mg/dL Final  . BUN 16/10/960401/28/2019 16  6 - 24 mg/dL Final  . Creatinine, Ser 05/02/2017 0.86  0.57 - 1.00 mg/dL Final  . GFR calc non Af Amer 05/02/2017 81  >59 mL/min/1.73 Final  . GFR calc Af Amer 05/02/2017 94  >59 mL/min/1.73 Final  . BUN/Creatinine Ratio 05/02/2017 19  9 - 23 Final  . Sodium 05/02/2017 143  134 - 144 mmol/L Final  . Potassium 05/02/2017 3.9  3.5 - 5.2 mmol/L Final  . Chloride 05/02/2017 102   96 - 106 mmol/L Final  . CO2 05/02/2017 21  20 - 29 mmol/L Final  . Calcium 05/02/2017 9.5  8.7 - 10.2 mg/dL Final  . Total Protein 05/02/2017 7.2  6.0 - 8.5 g/dL Final  . Albumin 54/09/811901/28/2019 4.4  3.5 - 5.5 g/dL Final  . Globulin, Total 05/02/2017 2.8  1.5 - 4.5 g/dL Final  . Albumin/Globulin Ratio 05/02/2017 1.6  1.2 - 2.2 Final  . Bilirubin Total 05/02/2017 0.3  0.0 - 1.2 mg/dL Final  . Alkaline Phosphatase 05/02/2017 127* 39 - 117 IU/L Final  . AST 05/02/2017 32  0 - 40 IU/L Final  . ALT 05/02/2017 49* 0 - 32 IU/L Final  . Hgb A1c MFr Bld 05/02/2017 5.8* 4.8 - 5.6 % Final   Comment:          Prediabetes: 5.7 - 6.4          Diabetes: >6.4          Glycemic control for adults with diabetes: <7.0   . Est. average glucose Bld gHb Est-m* 05/02/2017 120  mg/dL Final  . Magnesium 14/78/295601/28/2019 2.5* 1.6 - 2.3 mg/dL Final  . Phosphorus 21/30/865701/28/2019 3.6  2.5 - 4.5 mg/dL Final     Assessment & Plan:   1. Need for prophylactic vaccination and inoculation against influenza     ***  Patient will continue on current chronic medications other than changes noted above, so ok to refill when needed.   See after visit summary for patient specific instructions.  Orders Placed This Encounter  Procedures  . Flu Vaccine QUAD 36+ mos IM    No orders of the defined types were placed in this encounter.   Patient verbalized to me that they understand the following: diagnosis, what is being done for them, what to expect and what should be done at home.  Their questions have been answered. They understand that I am unable to predict every possible medication interaction or adverse outcome and that if any unexpected symptoms arise, they should contact us and their pharmacist, as well as never hesitate to seek urgent/emerg[facility] at [facility] or ER if they think it might be warranted.    Norberto SorensonEva , MD, MPH [facility]  [facility] [address] [place], [place]   [id number] [phone] Office phone  [phone] Office fax  02/15/18 11:58 AM

## 2018-02-15 NOTE — Patient Instructions (Signed)
° ° ° °  If you have lab work done today you will be contacted with your lab results within the next 2 weeks.  If you have not heard from us then please contact us. The fastest way to get your results is to register for My Chart. ° ° °IF you received an x-ray today, you will receive an invoice from [facility]. Please contact [facility] at [phone] with questions or concerns regarding your invoice.  ° °IF you received labwork today, you will receive an invoice from [facility]. Please contact [facility] at [phone] with questions or concerns regarding your invoice.  ° °Our billing staff will not be able to assist you with questions regarding bills from these companies. ° °You will be contacted with the lab results as soon as they are available. The fastest way to get your results is to activate your My Chart account. Instructions are located on the last page of this paperwork. If you have not heard from us regarding the results in 2 weeks, please contact this office. °  ° ° ° °

## 2018-02-16 ENCOUNTER — Telehealth: Payer: Self-pay

## 2018-02-16 LAB — COMPREHENSIVE METABOLIC PANEL
ALBUMIN: 4.7 g/dL (ref 3.5–5.5)
ALT: 43 IU/L — ABNORMAL HIGH (ref 0–32)
AST: 38 IU/L (ref 0–40)
Albumin/Globulin Ratio: 1.5 (ref 1.2–2.2)
Alkaline Phosphatase: 162 IU/L — ABNORMAL HIGH (ref 39–117)
BUN/Creatinine Ratio: 14 (ref 9–23)
BUN: 12 mg/dL (ref 6–24)
Bilirubin Total: 0.2 mg/dL (ref 0.0–1.2)
CO2: 18 mmol/L — ABNORMAL LOW (ref 20–29)
CREATININE: 0.83 mg/dL (ref 0.57–1.00)
Calcium: 9.7 mg/dL (ref 8.7–10.2)
Chloride: 105 mmol/L (ref 96–106)
GFR calc non Af Amer: 84 mL/min/{1.73_m2} (ref >59)
GFR, EST AFRICAN AMERICAN: 97 mL/min/{1.73_m2} (ref >59)
GLUCOSE: 81 mg/dL (ref 65–99)
Globulin, Total: 3.1 g/dL (ref 1.5–4.5)
Potassium: 4.6 mmol/L (ref 3.5–5.2)
Sodium: 146 mmol/L — ABNORMAL HIGH (ref 134–144)
TOTAL PROTEIN: 7.8 g/dL (ref 6.0–8.5)

## 2018-02-16 LAB — TSH: TSH: 3.27 u[IU]/mL (ref 0.450–4.500)

## 2018-02-16 LAB — MAGNESIUM: Magnesium: 2.4 mg/dL — ABNORMAL HIGH (ref 1.6–2.3)

## 2018-02-16 NOTE — Telephone Encounter (Signed)
Called and LVM for pt sister to call office back to follow up about  pt Melissa Stewart. She had an appointment yesterday but was not seen by provider due to appointments running late. If pt call back please have her speak to Golden HurterSierra Sherlynn Tourville CMA with any concerns.

## 2018-02-20 NOTE — Progress Notes (Signed)
This encounter was created in error - please disregard.

## 2018-03-08 IMAGING — DX DG ABDOMEN 1V
2 series · 3 of 3 positions shown · non-contrast
Comparison: None.

CLINICAL DATA: Abdominal pain, bloating, occasional vomiting. Fecal
incontinence.

EXAM:
ABDOMEN - 1 VIEW

[abdomen kub (1 of 2)]
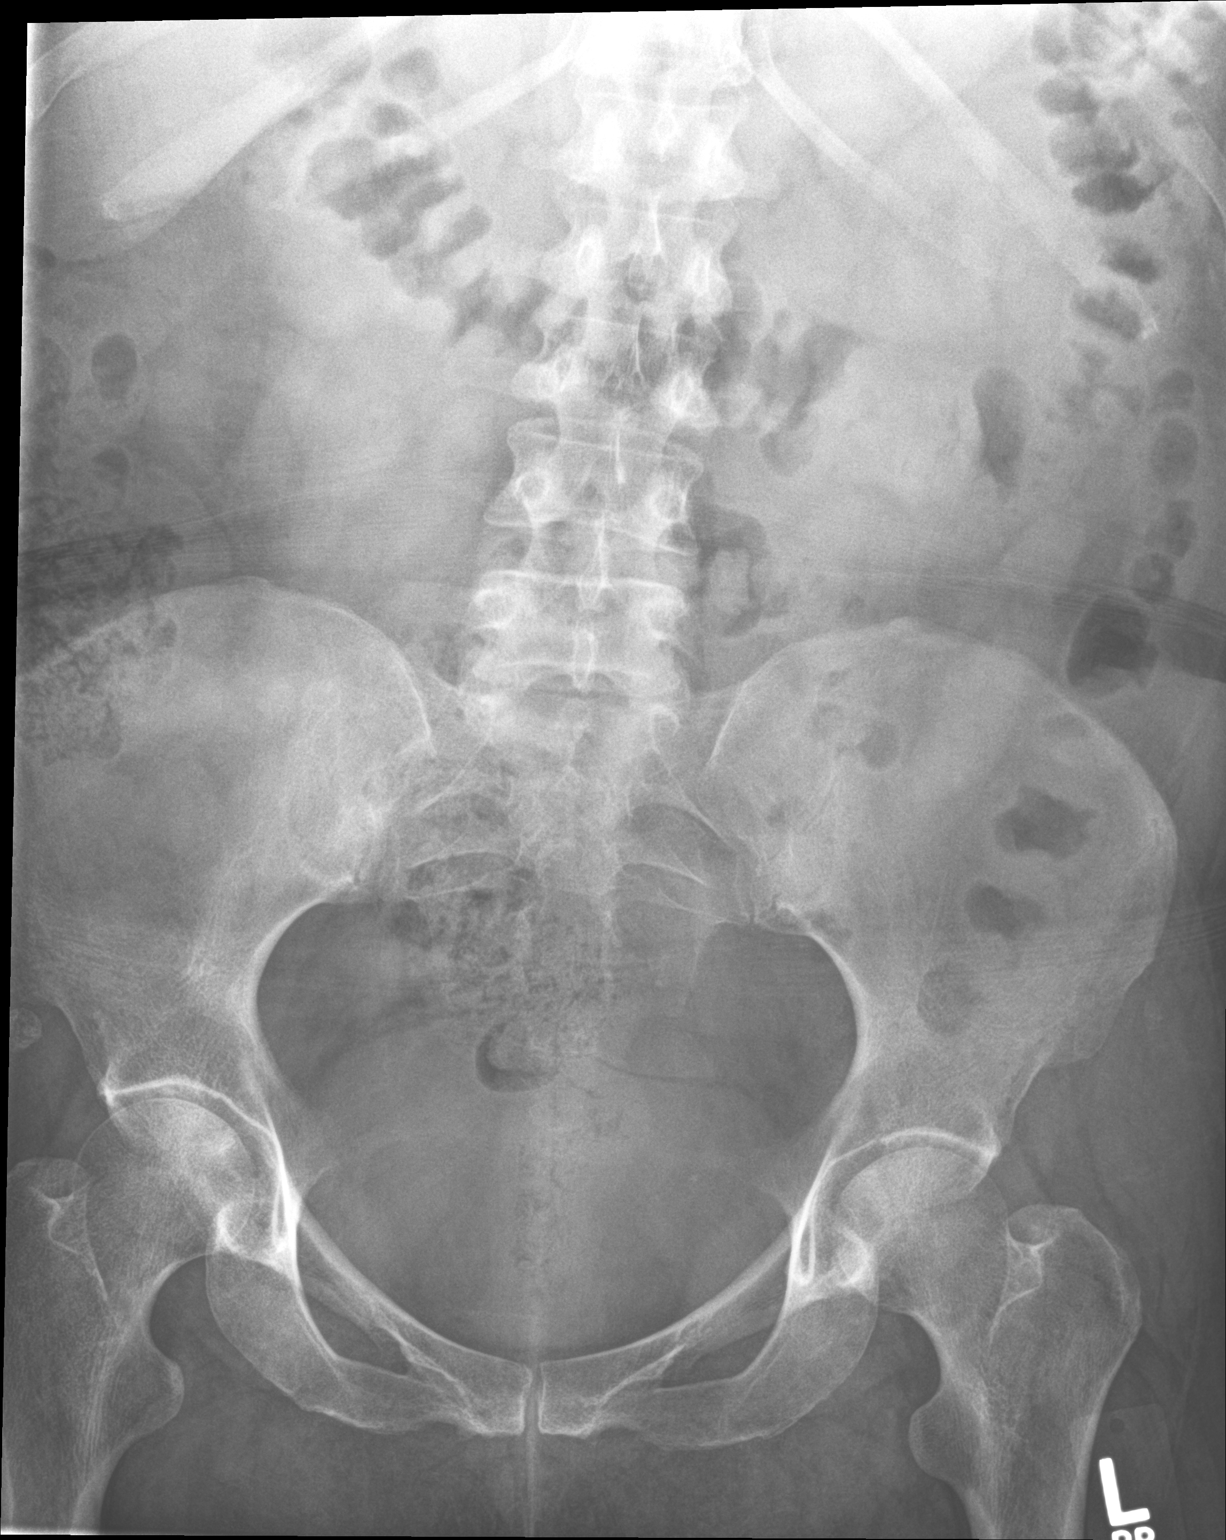

[Series 2: abdomen kub · 0.14mm/px · 2 of 2 slices shown (2 of 2)]
[im 1/2]
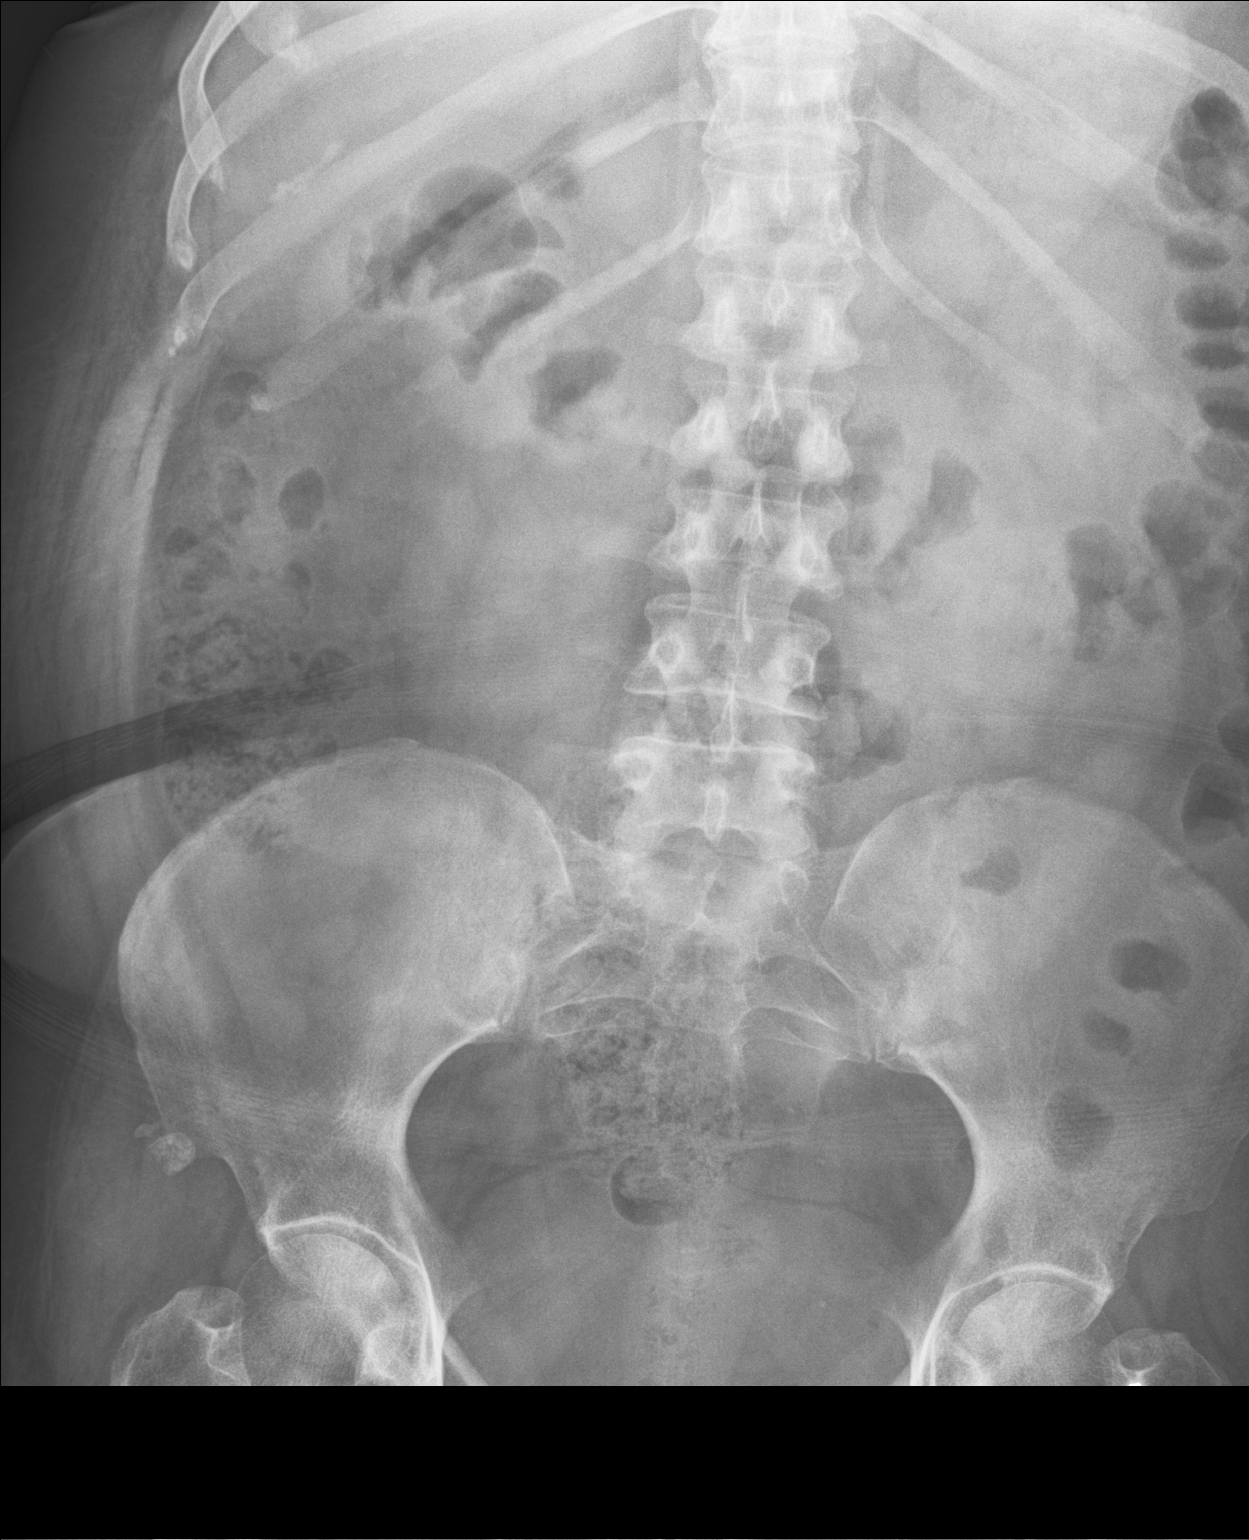
[im 2/2]
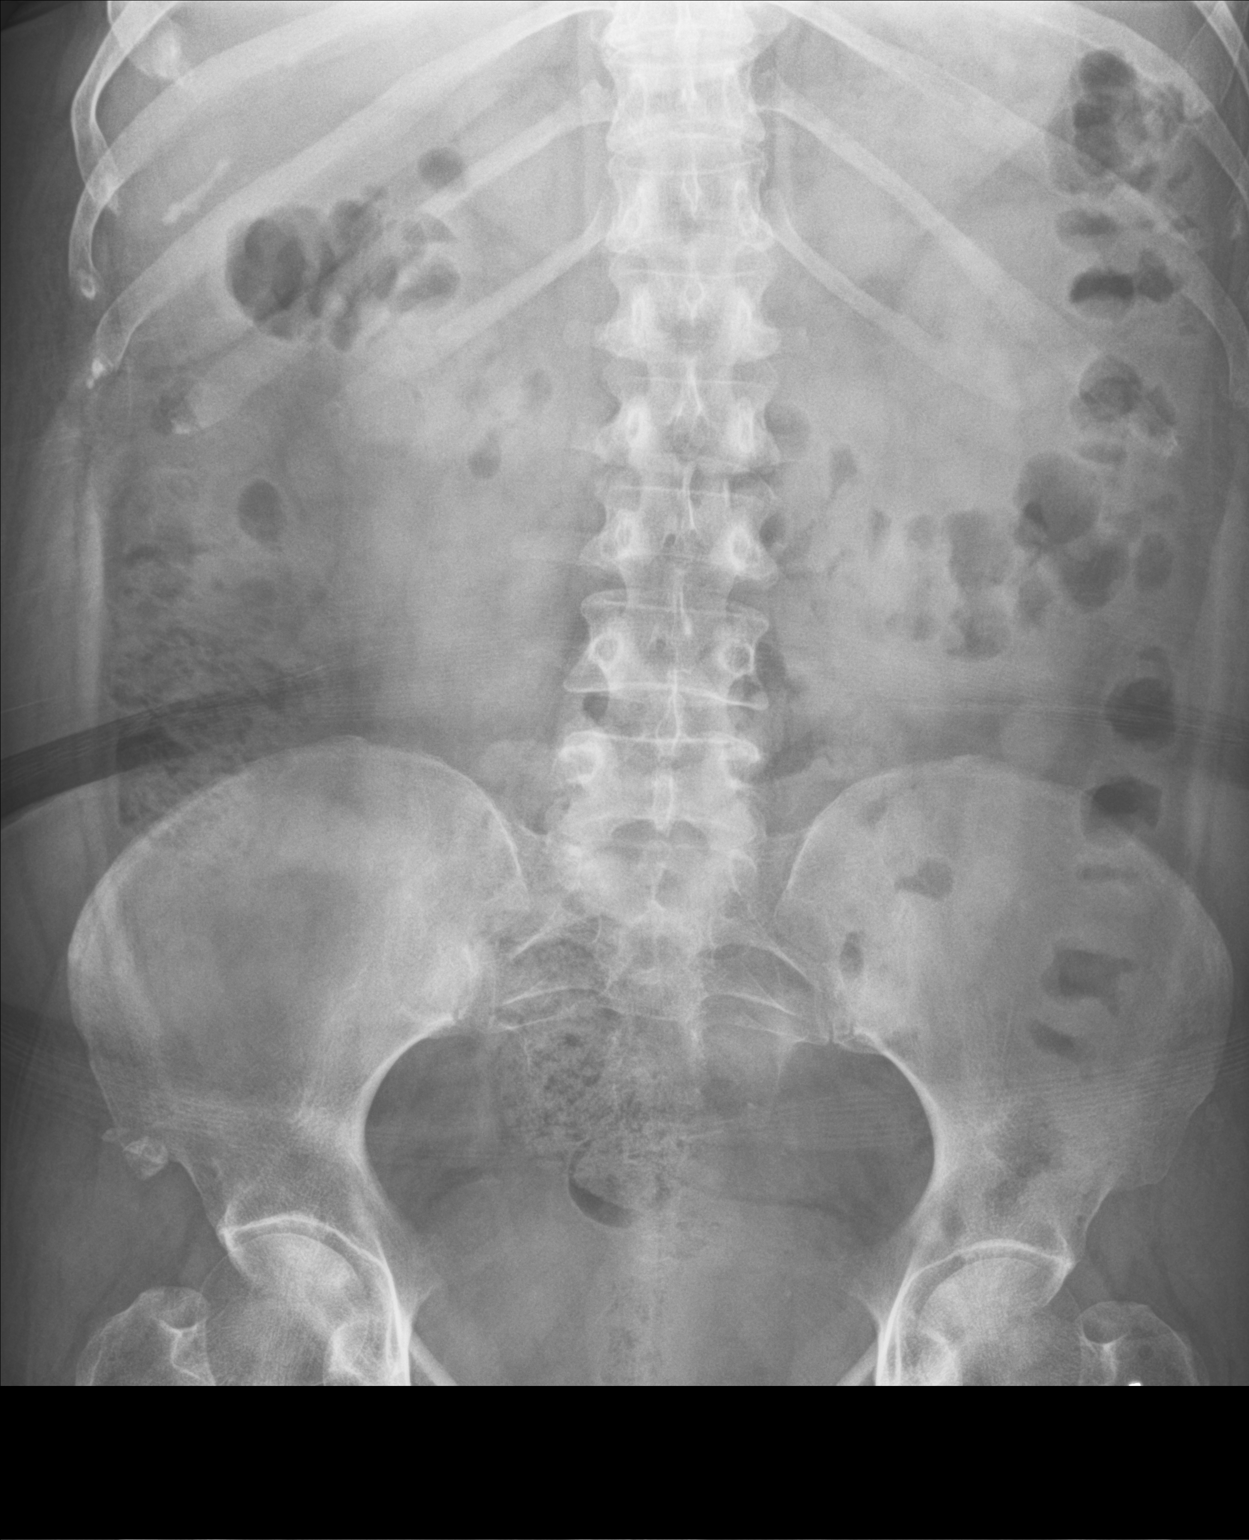

[3 of 3 positions shown; findings below may reference images not displayed]

FINDINGS: The diaphragm is excluded by collimation. Nonobstructive bowel gas
pattern. Moderate amount of formed stool throughout the colon
consistent with constipation. No suspicious calcifications or masses
seen radiographically. No evidence of organomegaly.

Osseus structures are intact.
IMPRESSION: Nonobstructive bowel gas pattern.

Constipation.

## 2018-03-23 NOTE — Telephone Encounter (Signed)
[place] Unknown Foleyobin Whitten PA on 8/22 and 11/8 for temazepam 30mg  #30.

## 2018-05-17 ENCOUNTER — Other Ambulatory Visit: Payer: Self-pay | Admitting: Family Medicine

## 2018-05-17 NOTE — Telephone Encounter (Signed)
Pt will call back tomorrow with daughter present. Needs TOC to new provider.

## 2018-05-29 DIAGNOSIS — Z719 Counseling, unspecified: Secondary | ICD-10-CM | POA: Diagnosis not present

## 2018-05-29 DIAGNOSIS — R531 Weakness: Secondary | ICD-10-CM | POA: Diagnosis not present

## 2018-05-29 DIAGNOSIS — G319 Degenerative disease of nervous system, unspecified: Secondary | ICD-10-CM | POA: Diagnosis not present

## 2018-05-29 DIAGNOSIS — R413 Other amnesia: Secondary | ICD-10-CM | POA: Diagnosis not present

## 2018-08-03 DIAGNOSIS — Z8673 Personal history of transient ischemic attack (TIA), and cerebral infarction without residual deficits: Secondary | ICD-10-CM | POA: Diagnosis not present

## 2018-08-03 DIAGNOSIS — F209 Schizophrenia, unspecified: Secondary | ICD-10-CM | POA: Diagnosis not present

## 2018-08-03 DIAGNOSIS — R0902 Hypoxemia: Secondary | ICD-10-CM | POA: Diagnosis not present

## 2018-08-03 DIAGNOSIS — R05 Cough: Secondary | ICD-10-CM | POA: Diagnosis not present

## 2018-08-03 DIAGNOSIS — J1289 Other viral pneumonia: Secondary | ICD-10-CM | POA: Diagnosis not present

## 2018-08-03 DIAGNOSIS — R0602 Shortness of breath: Secondary | ICD-10-CM | POA: Diagnosis not present

## 2018-08-03 DIAGNOSIS — R630 Anorexia: Secondary | ICD-10-CM | POA: Diagnosis not present

## 2018-08-04 DIAGNOSIS — F05 Delirium due to known physiological condition: Secondary | ICD-10-CM | POA: Diagnosis not present

## 2018-08-04 DIAGNOSIS — B9789 Other viral agents as the cause of diseases classified elsewhere: Secondary | ICD-10-CM | POA: Diagnosis present

## 2018-08-04 DIAGNOSIS — J9601 Acute respiratory failure with hypoxia: Secondary | ICD-10-CM | POA: Diagnosis not present

## 2018-08-04 DIAGNOSIS — Z8673 Personal history of transient ischemic attack (TIA), and cerebral infarction without residual deficits: Secondary | ICD-10-CM | POA: Diagnosis not present

## 2018-08-04 DIAGNOSIS — A4189 Other specified sepsis: Secondary | ICD-10-CM | POA: Diagnosis present

## 2018-08-04 DIAGNOSIS — R918 Other nonspecific abnormal finding of lung field: Secondary | ICD-10-CM | POA: Diagnosis not present

## 2018-08-04 DIAGNOSIS — R0600 Dyspnea, unspecified: Secondary | ICD-10-CM | POA: Diagnosis not present

## 2018-08-04 DIAGNOSIS — R630 Anorexia: Secondary | ICD-10-CM | POA: Diagnosis present

## 2018-08-04 DIAGNOSIS — F419 Anxiety disorder, unspecified: Secondary | ICD-10-CM | POA: Diagnosis not present

## 2018-08-04 DIAGNOSIS — J1289 Other viral pneumonia: Secondary | ICD-10-CM | POA: Diagnosis not present

## 2018-08-04 DIAGNOSIS — G3189 Other specified degenerative diseases of nervous system: Secondary | ICD-10-CM | POA: Diagnosis not present

## 2018-08-04 DIAGNOSIS — M4140 Neuromuscular scoliosis, site unspecified: Secondary | ICD-10-CM | POA: Diagnosis not present

## 2018-08-04 DIAGNOSIS — R700 Elevated erythrocyte sedimentation rate: Secondary | ICD-10-CM | POA: Diagnosis not present

## 2018-08-04 DIAGNOSIS — F88 Other disorders of psychological development: Secondary | ICD-10-CM | POA: Diagnosis present

## 2018-08-04 DIAGNOSIS — R740 Nonspecific elevation of levels of transaminase and lactic acid dehydrogenase [LDH]: Secondary | ICD-10-CM | POA: Diagnosis not present

## 2018-08-04 DIAGNOSIS — Z515 Encounter for palliative care: Secondary | ICD-10-CM | POA: Diagnosis not present

## 2018-08-04 DIAGNOSIS — R0902 Hypoxemia: Secondary | ICD-10-CM | POA: Diagnosis not present

## 2018-08-04 DIAGNOSIS — F209 Schizophrenia, unspecified: Secondary | ICD-10-CM | POA: Diagnosis not present

## 2018-08-04 DIAGNOSIS — G319 Degenerative disease of nervous system, unspecified: Secondary | ICD-10-CM | POA: Diagnosis not present

## 2018-08-04 DIAGNOSIS — Z9981 Dependence on supplemental oxygen: Secondary | ICD-10-CM | POA: Diagnosis not present

## 2018-08-04 DIAGNOSIS — Z66 Do not resuscitate: Secondary | ICD-10-CM | POA: Diagnosis not present

## 2018-08-04 DIAGNOSIS — G709 Myoneural disorder, unspecified: Secondary | ICD-10-CM | POA: Diagnosis not present

## 2018-08-04 DIAGNOSIS — R4189 Other symptoms and signs involving cognitive functions and awareness: Secondary | ICD-10-CM | POA: Diagnosis not present

## 2018-08-04 DIAGNOSIS — U071 COVID-19: Secondary | ICD-10-CM | POA: Diagnosis not present

## 2018-08-04 DIAGNOSIS — E873 Alkalosis: Secondary | ICD-10-CM | POA: Diagnosis not present

## 2018-08-15 DIAGNOSIS — F060 Psychotic disorder with hallucinations due to known physiological condition: Secondary | ICD-10-CM | POA: Diagnosis not present

## 2018-08-15 DIAGNOSIS — J9601 Acute respiratory failure with hypoxia: Secondary | ICD-10-CM | POA: Diagnosis not present

## 2018-08-15 DIAGNOSIS — Z8673 Personal history of transient ischemic attack (TIA), and cerebral infarction without residual deficits: Secondary | ICD-10-CM | POA: Diagnosis not present

## 2018-08-15 DIAGNOSIS — J1289 Other viral pneumonia: Secondary | ICD-10-CM | POA: Diagnosis not present

## 2018-08-15 DIAGNOSIS — F88 Other disorders of psychological development: Secondary | ICD-10-CM | POA: Diagnosis not present

## 2018-08-15 DIAGNOSIS — G3189 Other specified degenerative diseases of nervous system: Secondary | ICD-10-CM | POA: Diagnosis not present

## 2018-08-15 DIAGNOSIS — U071 COVID-19: Secondary | ICD-10-CM | POA: Diagnosis not present

## 2018-08-15 DIAGNOSIS — Z66 Do not resuscitate: Secondary | ICD-10-CM | POA: Diagnosis not present

## 2018-08-15 DIAGNOSIS — F209 Schizophrenia, unspecified: Secondary | ICD-10-CM | POA: Diagnosis not present

## 2018-08-15 DIAGNOSIS — F0281 Dementia in other diseases classified elsewhere with behavioral disturbance: Secondary | ICD-10-CM | POA: Diagnosis not present

## 2018-08-16 DIAGNOSIS — G3189 Other specified degenerative diseases of nervous system: Secondary | ICD-10-CM | POA: Diagnosis not present

## 2018-08-16 DIAGNOSIS — J1289 Other viral pneumonia: Secondary | ICD-10-CM | POA: Diagnosis not present

## 2018-08-16 DIAGNOSIS — F060 Psychotic disorder with hallucinations due to known physiological condition: Secondary | ICD-10-CM | POA: Diagnosis not present

## 2018-08-16 DIAGNOSIS — U071 COVID-19: Secondary | ICD-10-CM | POA: Diagnosis not present

## 2018-08-16 DIAGNOSIS — J9601 Acute respiratory failure with hypoxia: Secondary | ICD-10-CM | POA: Diagnosis not present

## 2018-08-16 DIAGNOSIS — F0281 Dementia in other diseases classified elsewhere with behavioral disturbance: Secondary | ICD-10-CM | POA: Diagnosis not present

## 2018-08-17 DIAGNOSIS — F060 Psychotic disorder with hallucinations due to known physiological condition: Secondary | ICD-10-CM | POA: Diagnosis not present

## 2018-08-17 DIAGNOSIS — J1289 Other viral pneumonia: Secondary | ICD-10-CM | POA: Diagnosis not present

## 2018-08-17 DIAGNOSIS — J9601 Acute respiratory failure with hypoxia: Secondary | ICD-10-CM | POA: Diagnosis not present

## 2018-08-17 DIAGNOSIS — U071 COVID-19: Secondary | ICD-10-CM | POA: Diagnosis not present

## 2018-08-17 DIAGNOSIS — G3189 Other specified degenerative diseases of nervous system: Secondary | ICD-10-CM | POA: Diagnosis not present

## 2018-08-17 DIAGNOSIS — F0281 Dementia in other diseases classified elsewhere with behavioral disturbance: Secondary | ICD-10-CM | POA: Diagnosis not present

## 2018-08-18 DIAGNOSIS — F0281 Dementia in other diseases classified elsewhere with behavioral disturbance: Secondary | ICD-10-CM | POA: Diagnosis not present

## 2018-08-18 DIAGNOSIS — G3189 Other specified degenerative diseases of nervous system: Secondary | ICD-10-CM | POA: Diagnosis not present

## 2018-08-18 DIAGNOSIS — U071 COVID-19: Secondary | ICD-10-CM | POA: Diagnosis not present

## 2018-08-18 DIAGNOSIS — J9601 Acute respiratory failure with hypoxia: Secondary | ICD-10-CM | POA: Diagnosis not present

## 2018-08-18 DIAGNOSIS — F060 Psychotic disorder with hallucinations due to known physiological condition: Secondary | ICD-10-CM | POA: Diagnosis not present

## 2018-08-18 DIAGNOSIS — J1289 Other viral pneumonia: Secondary | ICD-10-CM | POA: Diagnosis not present

## 2018-08-19 DIAGNOSIS — J9601 Acute respiratory failure with hypoxia: Secondary | ICD-10-CM | POA: Diagnosis not present

## 2018-08-19 DIAGNOSIS — J1289 Other viral pneumonia: Secondary | ICD-10-CM | POA: Diagnosis not present

## 2018-08-19 DIAGNOSIS — U071 COVID-19: Secondary | ICD-10-CM | POA: Diagnosis not present

## 2018-08-19 DIAGNOSIS — F0281 Dementia in other diseases classified elsewhere with behavioral disturbance: Secondary | ICD-10-CM | POA: Diagnosis not present

## 2018-08-19 DIAGNOSIS — G3189 Other specified degenerative diseases of nervous system: Secondary | ICD-10-CM | POA: Diagnosis not present

## 2018-08-19 DIAGNOSIS — F060 Psychotic disorder with hallucinations due to known physiological condition: Secondary | ICD-10-CM | POA: Diagnosis not present

## 2018-08-21 DIAGNOSIS — J1289 Other viral pneumonia: Secondary | ICD-10-CM | POA: Diagnosis not present

## 2018-08-21 DIAGNOSIS — G3189 Other specified degenerative diseases of nervous system: Secondary | ICD-10-CM | POA: Diagnosis not present

## 2018-08-21 DIAGNOSIS — U071 COVID-19: Secondary | ICD-10-CM | POA: Diagnosis not present

## 2018-08-21 DIAGNOSIS — J9601 Acute respiratory failure with hypoxia: Secondary | ICD-10-CM | POA: Diagnosis not present

## 2018-08-21 DIAGNOSIS — F060 Psychotic disorder with hallucinations due to known physiological condition: Secondary | ICD-10-CM | POA: Diagnosis not present

## 2018-08-21 DIAGNOSIS — F0281 Dementia in other diseases classified elsewhere with behavioral disturbance: Secondary | ICD-10-CM | POA: Diagnosis not present

## 2018-08-22 DIAGNOSIS — F060 Psychotic disorder with hallucinations due to known physiological condition: Secondary | ICD-10-CM | POA: Diagnosis not present

## 2018-08-22 DIAGNOSIS — J9601 Acute respiratory failure with hypoxia: Secondary | ICD-10-CM | POA: Diagnosis not present

## 2018-08-22 DIAGNOSIS — G3189 Other specified degenerative diseases of nervous system: Secondary | ICD-10-CM | POA: Diagnosis not present

## 2018-08-22 DIAGNOSIS — U071 COVID-19: Secondary | ICD-10-CM | POA: Diagnosis not present

## 2018-08-22 DIAGNOSIS — J1289 Other viral pneumonia: Secondary | ICD-10-CM | POA: Diagnosis not present

## 2018-08-22 DIAGNOSIS — F0281 Dementia in other diseases classified elsewhere with behavioral disturbance: Secondary | ICD-10-CM | POA: Diagnosis not present

## 2018-08-23 DIAGNOSIS — F060 Psychotic disorder with hallucinations due to known physiological condition: Secondary | ICD-10-CM | POA: Diagnosis not present

## 2018-08-23 DIAGNOSIS — J9601 Acute respiratory failure with hypoxia: Secondary | ICD-10-CM | POA: Diagnosis not present

## 2018-08-23 DIAGNOSIS — U071 COVID-19: Secondary | ICD-10-CM | POA: Diagnosis not present

## 2018-08-23 DIAGNOSIS — F0281 Dementia in other diseases classified elsewhere with behavioral disturbance: Secondary | ICD-10-CM | POA: Diagnosis not present

## 2018-08-23 DIAGNOSIS — J1289 Other viral pneumonia: Secondary | ICD-10-CM | POA: Diagnosis not present

## 2018-08-23 DIAGNOSIS — G3189 Other specified degenerative diseases of nervous system: Secondary | ICD-10-CM | POA: Diagnosis not present

## 2018-08-24 DIAGNOSIS — U071 COVID-19: Secondary | ICD-10-CM | POA: Diagnosis not present

## 2018-08-24 DIAGNOSIS — F060 Psychotic disorder with hallucinations due to known physiological condition: Secondary | ICD-10-CM | POA: Diagnosis not present

## 2018-08-24 DIAGNOSIS — G3189 Other specified degenerative diseases of nervous system: Secondary | ICD-10-CM | POA: Diagnosis not present

## 2018-08-24 DIAGNOSIS — J1289 Other viral pneumonia: Secondary | ICD-10-CM | POA: Diagnosis not present

## 2018-08-24 DIAGNOSIS — J9601 Acute respiratory failure with hypoxia: Secondary | ICD-10-CM | POA: Diagnosis not present

## 2018-08-24 DIAGNOSIS — F0281 Dementia in other diseases classified elsewhere with behavioral disturbance: Secondary | ICD-10-CM | POA: Diagnosis not present

## 2018-08-28 DIAGNOSIS — J9601 Acute respiratory failure with hypoxia: Secondary | ICD-10-CM | POA: Diagnosis not present

## 2018-08-28 DIAGNOSIS — J1289 Other viral pneumonia: Secondary | ICD-10-CM | POA: Diagnosis not present

## 2018-08-28 DIAGNOSIS — F0281 Dementia in other diseases classified elsewhere with behavioral disturbance: Secondary | ICD-10-CM | POA: Diagnosis not present

## 2018-08-28 DIAGNOSIS — F060 Psychotic disorder with hallucinations due to known physiological condition: Secondary | ICD-10-CM | POA: Diagnosis not present

## 2018-08-28 DIAGNOSIS — U071 COVID-19: Secondary | ICD-10-CM | POA: Diagnosis not present

## 2018-08-28 DIAGNOSIS — G3189 Other specified degenerative diseases of nervous system: Secondary | ICD-10-CM | POA: Diagnosis not present

## 2018-08-30 DIAGNOSIS — F88 Other disorders of psychological development: Secondary | ICD-10-CM | POA: Diagnosis not present

## 2018-08-30 DIAGNOSIS — J9601 Acute respiratory failure with hypoxia: Secondary | ICD-10-CM | POA: Diagnosis not present

## 2018-08-30 DIAGNOSIS — U071 COVID-19: Secondary | ICD-10-CM | POA: Diagnosis not present

## 2018-08-30 DIAGNOSIS — F060 Psychotic disorder with hallucinations due to known physiological condition: Secondary | ICD-10-CM | POA: Diagnosis not present

## 2018-08-30 DIAGNOSIS — Z66 Do not resuscitate: Secondary | ICD-10-CM | POA: Diagnosis not present

## 2018-08-30 DIAGNOSIS — F0281 Dementia in other diseases classified elsewhere with behavioral disturbance: Secondary | ICD-10-CM | POA: Diagnosis not present

## 2018-08-30 DIAGNOSIS — J1289 Other viral pneumonia: Secondary | ICD-10-CM | POA: Diagnosis not present

## 2018-08-30 DIAGNOSIS — Z8673 Personal history of transient ischemic attack (TIA), and cerebral infarction without residual deficits: Secondary | ICD-10-CM | POA: Diagnosis not present

## 2018-08-30 DIAGNOSIS — G3189 Other specified degenerative diseases of nervous system: Secondary | ICD-10-CM | POA: Diagnosis not present

## 2018-08-30 DIAGNOSIS — F209 Schizophrenia, unspecified: Secondary | ICD-10-CM | POA: Diagnosis not present

## 2018-09-01 DIAGNOSIS — G3189 Other specified degenerative diseases of nervous system: Secondary | ICD-10-CM | POA: Diagnosis not present

## 2018-09-01 DIAGNOSIS — J9601 Acute respiratory failure with hypoxia: Secondary | ICD-10-CM | POA: Diagnosis not present

## 2018-09-01 DIAGNOSIS — F0281 Dementia in other diseases classified elsewhere with behavioral disturbance: Secondary | ICD-10-CM | POA: Diagnosis not present

## 2018-09-01 DIAGNOSIS — F060 Psychotic disorder with hallucinations due to known physiological condition: Secondary | ICD-10-CM | POA: Diagnosis not present

## 2018-09-01 DIAGNOSIS — J1289 Other viral pneumonia: Secondary | ICD-10-CM | POA: Diagnosis not present

## 2018-09-01 DIAGNOSIS — U071 COVID-19: Secondary | ICD-10-CM | POA: Diagnosis not present

## 2018-09-04 DIAGNOSIS — J9601 Acute respiratory failure with hypoxia: Secondary | ICD-10-CM | POA: Diagnosis not present

## 2018-09-04 DIAGNOSIS — U071 COVID-19: Secondary | ICD-10-CM | POA: Diagnosis not present

## 2018-09-04 DIAGNOSIS — J1289 Other viral pneumonia: Secondary | ICD-10-CM | POA: Diagnosis not present

## 2018-09-04 DIAGNOSIS — G3189 Other specified degenerative diseases of nervous system: Secondary | ICD-10-CM | POA: Diagnosis not present

## 2018-09-04 DIAGNOSIS — F0281 Dementia in other diseases classified elsewhere with behavioral disturbance: Secondary | ICD-10-CM | POA: Diagnosis not present

## 2018-09-04 DIAGNOSIS — F060 Psychotic disorder with hallucinations due to known physiological condition: Secondary | ICD-10-CM | POA: Diagnosis not present

## 2018-11-20 DIAGNOSIS — R413 Other amnesia: Secondary | ICD-10-CM | POA: Diagnosis not present

## 2018-11-20 DIAGNOSIS — R531 Weakness: Secondary | ICD-10-CM | POA: Diagnosis not present

## 2018-11-20 DIAGNOSIS — G319 Degenerative disease of nervous system, unspecified: Secondary | ICD-10-CM | POA: Diagnosis not present

## 2018-11-20 DIAGNOSIS — Z719 Counseling, unspecified: Secondary | ICD-10-CM | POA: Diagnosis not present

## 2019-01-19 DIAGNOSIS — Z23 Encounter for immunization: Secondary | ICD-10-CM | POA: Diagnosis not present
